# Patient Record
Sex: Male | Born: 1945 | Race: Black or African American | Hispanic: No | Marital: Married | Smoking: Former smoker
Health system: Southern US, Community
[De-identification: ages and names within clinical notes are randomized; demographics above are authoritative.]

## PROBLEM LIST (undated history)

## (undated) DIAGNOSIS — K219 Gastro-esophageal reflux disease without esophagitis: Secondary | ICD-10-CM

## (undated) DIAGNOSIS — G8929 Other chronic pain: Secondary | ICD-10-CM

## (undated) DIAGNOSIS — C189 Malignant neoplasm of colon, unspecified: Secondary | ICD-10-CM

## (undated) DIAGNOSIS — R7303 Prediabetes: Secondary | ICD-10-CM

## (undated) DIAGNOSIS — Z9989 Dependence on other enabling machines and devices: Secondary | ICD-10-CM

## (undated) DIAGNOSIS — F419 Anxiety disorder, unspecified: Secondary | ICD-10-CM

## (undated) DIAGNOSIS — M545 Low back pain, unspecified: Secondary | ICD-10-CM

## (undated) DIAGNOSIS — I219 Acute myocardial infarction, unspecified: Secondary | ICD-10-CM

## (undated) DIAGNOSIS — G4733 Obstructive sleep apnea (adult) (pediatric): Secondary | ICD-10-CM

## (undated) DIAGNOSIS — J45909 Unspecified asthma, uncomplicated: Secondary | ICD-10-CM

## (undated) DIAGNOSIS — E039 Hypothyroidism, unspecified: Secondary | ICD-10-CM

## (undated) DIAGNOSIS — I10 Essential (primary) hypertension: Secondary | ICD-10-CM

## (undated) DIAGNOSIS — Z77098 Contact with and (suspected) exposure to other hazardous, chiefly nonmedicinal, chemicals: Secondary | ICD-10-CM

## (undated) DIAGNOSIS — I429 Cardiomyopathy, unspecified: Secondary | ICD-10-CM

## (undated) DIAGNOSIS — M1990 Unspecified osteoarthritis, unspecified site: Secondary | ICD-10-CM

## (undated) DIAGNOSIS — I2510 Atherosclerotic heart disease of native coronary artery without angina pectoris: Secondary | ICD-10-CM

## (undated) HISTORY — DX: Gastro-esophageal reflux disease without esophagitis: K21.9

## (undated) HISTORY — DX: Anxiety disorder, unspecified: F41.9

## (undated) HISTORY — PX: BACK SURGERY: SHX140

## (undated) HISTORY — DX: Atherosclerotic heart disease of native coronary artery without angina pectoris: I25.10

## (undated) HISTORY — DX: Malignant neoplasm of colon, unspecified: C18.9

## (undated) HISTORY — PX: CATARACT EXTRACTION W/ INTRAOCULAR LENS  IMPLANT, BILATERAL: SHX1307

## (undated) HISTORY — DX: Hypothyroidism, unspecified: E03.9

## (undated) HISTORY — DX: Essential (primary) hypertension: I10

## (undated) HISTORY — PX: POSTERIOR LUMBAR FUSION: SHX6036

---
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
## 1990-04-28 DIAGNOSIS — I219 Acute myocardial infarction, unspecified: Secondary | ICD-10-CM

## 1990-04-28 HISTORY — DX: Acute myocardial infarction, unspecified: I21.9

## 1990-05-29 HISTORY — PX: CARDIAC CATHETERIZATION: SHX172

## 2013-06-18 ENCOUNTER — Encounter: Payer: Self-pay | Admitting: Cardiology

## 2013-08-20 ENCOUNTER — Telehealth: Payer: Self-pay | Admitting: *Deleted

## 2013-08-20 ENCOUNTER — Encounter: Payer: Self-pay | Admitting: *Deleted

## 2013-08-20 DIAGNOSIS — Z01810 Encounter for preprocedural cardiovascular examination: Secondary | ICD-10-CM

## 2013-08-20 NOTE — Telephone Encounter (Signed)
Office received a phone call from Edwin Monroe at [facility] today regarding patient Edwin Monroe. He has been diagnosed with a colonic abnormality/colitis and there is concern also for potential neoplasm based on recent colonoscopy. Edwin Monroe plans colon surgery this Friday, and the patient is in fact already undergoing a bowel prep. We were asked to schedule the patient for preoperative cardiac assessment. Limited information is available. 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 It is also reported that his ECG was nonspecific.   We have added the patient on at the earliest available slot which is tomorrow afternoon (Thursday), and I have also arranged for the patient have an echocardiogram at that time to define LVEF. Patient will be seen by Edwin Monroe tomorrow for clinical assessment. I explained to Edwin Monroe that I doubted Edwin Monroe would require other extensive cardiac testing, and can likely proceed with planned surgery as long as he was truly clinically stable, repeat ECG showed no concerning findings, and his echocardiogram was also reassuring. Otherwise he would need to be prepared for rescheduling surgery if additional cardiac testing was necessary. Edwin Monroe asked Edwin Monroe contact him tomorrow with final recommendations.  Satira Sark, M.D., F.A.C.C.

## 2013-08-21 ENCOUNTER — Encounter: Payer: Self-pay | Admitting: Cardiology

## 2013-08-21 ENCOUNTER — Other Ambulatory Visit (INDEPENDENT_AMBULATORY_CARE_PROVIDER_SITE_OTHER): Payer: Medicare Other

## 2013-08-21 ENCOUNTER — Ambulatory Visit (INDEPENDENT_AMBULATORY_CARE_PROVIDER_SITE_OTHER): Payer: Medicare Other | Admitting: Cardiology

## 2013-08-21 ENCOUNTER — Other Ambulatory Visit: Payer: Self-pay

## 2013-08-21 VITALS — BP 135/87 | HR 89 | Ht 68.0 in | Wt 196.1 lb

## 2013-08-21 DIAGNOSIS — I059 Rheumatic mitral valve disease, unspecified: Secondary | ICD-10-CM

## 2013-08-21 DIAGNOSIS — I519 Heart disease, unspecified: Secondary | ICD-10-CM

## 2013-08-21 DIAGNOSIS — I10 Essential (primary) hypertension: Secondary | ICD-10-CM

## 2013-08-21 DIAGNOSIS — Z01810 Encounter for preprocedural cardiovascular examination: Secondary | ICD-10-CM

## 2013-08-21 DIAGNOSIS — I359 Nonrheumatic aortic valve disorder, unspecified: Secondary | ICD-10-CM

## 2013-08-21 DIAGNOSIS — I5189 Other ill-defined heart diseases: Secondary | ICD-10-CM

## 2013-08-21 DIAGNOSIS — G4733 Obstructive sleep apnea (adult) (pediatric): Secondary | ICD-10-CM

## 2013-08-21 DIAGNOSIS — I2510 Atherosclerotic heart disease of native coronary artery without angina pectoris: Secondary | ICD-10-CM

## 2013-08-21 MED ORDER — CARVEDILOL 3.125 MG PO TABS: 3.1250 mg | ORAL_TABLET | Freq: Two times a day (BID) | ORAL | Status: DC

## 2013-08-21 NOTE — Patient Instructions (Signed)
Your physician recommends that you schedule a follow-up appointment in: 4 months. You will receive a reminder letter in the mail in about 1-2 months reminding you to call and schedule your appointment. If you don't receive this letter, please contact our office. Your physician has recommended you make the following change in your medication:  Start carvedilol 3.125 mg twice daily. Your new prescription has been sent to your pharmacy.  All other medications will remain the same.

## 2013-08-21 NOTE — Progress Notes (Signed)
Clinical Summary Edwin Monroe is a 68 y.o.male seen today as a new patient, he is referred for preoperative evaluation prior to abdominal surgery  1. History of chest pain - notes indicate remote history of MI in 17 in [place] at [place]. Transferred to [place], reports he was told did not have any blockages - since that time has not had any heart troubles.  - no LE edema, no orthopnea, no PND. 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  3. HTN - checks bp at home regularly, typically around 140s/80s - compliant with meds  4. OSA - compliant with CPAP   Past Medical History  Diagnosis Date  . Hypertension   . GERD (gastroesophageal reflux disease)   . Anxiety disorder   . CAD (coronary artery disease)     REMOTE H/O MI IN 1991  . Hypothyroidism      Allergies  Allergen Reactions  . Prednisone   . Toradol [Ketorolac Tromethamine]      Current Outpatient Prescriptions  Medication Sig Dispense Refill  . aspirin EC 81 MG tablet Take 81 mg by mouth daily.      Marland Kitchen buPROPion (WELLBUTRIN SR) 200 MG 12 hr tablet Take 200 mg by mouth 2 (two) times daily.      . Calcium Carbonate-Vitamin D (CALTRATE 600+D PO) Take 1 tablet by mouth 3 (three) times daily.      . cyclobenzaprine (FLEXERIL) 10 MG tablet Take 10 mg by mouth at bedtime.      . folic acid (FOLVITE) 1 MG tablet Take 1 mg by mouth daily.      . furosemide (LASIX) 20 MG tablet Take 20 mg by mouth daily.      Marland Kitchen ibuprofen (ADVIL,MOTRIN) 800 MG tablet Take 800 mg by mouth every 8 (eight) hours as needed.      Marland Kitchen lisinopril (PRINIVIL,ZESTRIL) 40 MG tablet Take 40 mg by mouth daily.      Marland Kitchen LORazepam (ATIVAN) 1 MG tablet Take 1 mg by mouth at bedtime.      . Multiple Vitamin (MULTIVITAMIN) tablet  Take 1 tablet by mouth daily.      . nisoldipine (SULAR) 17 MG 24 hr tablet Take 17 mg by mouth daily.      Marland Kitchen omeprazole (PRILOSEC) 20 MG capsule Take 20 mg by mouth daily.      Marland Kitchen oxyCODONE-acetaminophen (PERCOCET/ROXICET) 5-325 MG per tablet Take 1 tablet by mouth every 4 (four) hours as needed for severe pain.       No current facility-administered medications for this visit.     Past Surgical History  Procedure Laterality Date  . Back surgery      LOW BACK SURGERY X'S 2  . Appendectomy    . Cardiac catheterization       Allergies  Allergen Reactions  . Prednisone   . Toradol [Ketorolac Tromethamine]       Family History  Problem Relation Age of Onset  . Heart attack Father     MID 70'S  . Diabetes Mother   . Diabetes Brother     X'S 2  . Cancer Father   . Stroke Mother      Social History Edwin Monroe reports that he quit smoking about 11 years  ago. His smoking use included Cigars and Cigarettes. He smoked 1.00 pack per day. He does not have any smokeless tobacco history on file. Edwin Monroe has no alcohol history on file.   Review of Systems CONSTITUTIONAL: No weight loss, fever, chills, weakness or fatigue.  HEENT: Eyes: No visual loss, blurred vision, double vision or yellow sclerae.No hearing loss, sneezing, congestion, runny nose or sore throat.  SKIN: No rash or itching.  CARDIOVASCULAR: per HPI RESPIRATORY: No shortness of breath, cough or sputum.  GASTROINTESTINAL: No anorexia, nausea, vomiting or diarrhea. No abdominal pain or blood.  GENITOURINARY: No burning on urination, no polyuria NEUROLOGICAL: No headache, dizziness, syncope, paralysis, ataxia, numbness or tingling in the extremities. No change in bowel or bladder control.  MUSCULOSKELETAL: No muscle, back pain, joint pain or stiffness.  LYMPHATICS: No enlarged nodes. No history of splenectomy.  PSYCHIATRIC: No history of depression or anxiety.  ENDOCRINOLOGIC: No reports of sweating, cold or  heat intolerance. No polyuria or polydipsia.  Marland Kitchen   Physical Examination p 89 bp 135/87 WT 196 lbs BMI 30 Gen: resting comfortably, no acute distress HEENT: no scleral icterus, pupils equal round and reactive, no palptable cervical adenopathy,  CV: RRR, no m/r/g, no JVD, no carotid bruits Resp: Clear to auscultation bilaterally GI: abdomen is soft, non-tender, non-distended, normal bowel sounds, no hepatosplenomegaly MSK: extremities are warm, no edema.  Skin: warm, no rash Neuro:  no focal deficits Psych: appropriate affect   Diagnostic Studies [facility] EKG 05/2013 NSR, LAE, inferior Q waves  [facility] Cath 05/30/1990 DIAGNOSTIC SUMMARY Coronary Artery Disease RCA system: normal Left Main: normal No significant CAD indicated LAD system: normal Left Ventriculogram LCX system: normal Ejection Fraction: 71%  COMMENT The LAD curls the Apex. The RCA and Cirx arteries both contribute small PDA's.  07/2013 Echo LVEF 45-50%, mild LVH, grade I diastolic dysfunction, hypokinesis of the apica anterior and apical latera walls.   Assessment and Plan  1. Mild LV systolic dysfunction - echo with mildly decreased to low normal LV function, patient denies any CHF symptoms. Does have some wall motion abnormalities suggestive of possible prior event - will start low dose coreg in setting of mild LV systolic dysfunction, patient reports he is going to postpone the surgery for not within the next 2 weeks so ok to initiate beta blocker at this time  2. Preoperative evaluation  - patient is being considered for an intermediate risk procedure. He has not active acute cardiac conditions. He tolerates greater than 4 METs on a regular basis without limitation. Recommend proceeding with surgery as planned   3. HTN - continue current meds, add core as described above  4. OSA - continue CPAP    Edwin Monroe, M.D., F.A.C.C.

## 2013-08-24 DIAGNOSIS — I10 Essential (primary) hypertension: Secondary | ICD-10-CM | POA: Insufficient documentation

## 2013-08-24 DIAGNOSIS — I5189 Other ill-defined heart diseases: Secondary | ICD-10-CM | POA: Insufficient documentation

## 2013-08-24 DIAGNOSIS — G4733 Obstructive sleep apnea (adult) (pediatric): Secondary | ICD-10-CM | POA: Insufficient documentation

## 2013-08-24 DIAGNOSIS — I519 Heart disease, unspecified: Secondary | ICD-10-CM | POA: Insufficient documentation

## 2013-10-08 ENCOUNTER — Encounter: Payer: Self-pay | Admitting: Cardiology

## 2013-12-15 ENCOUNTER — Encounter: Payer: Self-pay | Admitting: Cardiology

## 2013-12-15 ENCOUNTER — Ambulatory Visit (INDEPENDENT_AMBULATORY_CARE_PROVIDER_SITE_OTHER): Payer: Medicare Other | Admitting: Cardiology

## 2013-12-15 VITALS — BP 121/67 | HR 76 | Ht 68.0 in | Wt 198.0 lb

## 2013-12-15 DIAGNOSIS — I519 Heart disease, unspecified: Secondary | ICD-10-CM

## 2013-12-15 DIAGNOSIS — I5189 Other ill-defined heart diseases: Secondary | ICD-10-CM

## 2013-12-15 DIAGNOSIS — I10 Essential (primary) hypertension: Secondary | ICD-10-CM

## 2013-12-15 MED ORDER — CARVEDILOL 12.5 MG PO TABS: 12.5000 mg | ORAL_TABLET | Freq: Two times a day (BID) | ORAL | Status: DC

## 2013-12-15 NOTE — Progress Notes (Signed)
Clinical Summary Edwin Monroe is a 68 y.o.male seen today for follow up of the following medical problems.   1. History of chest pain  - notes indicate remote history of MI in 61 in [place] at [place]. Transferred to [place], reports he was told did not have any blockages by cath at that time - since that time has not had any chest pain   2. HTN  - compliant with meds   3. OSA  - compliant with CPAP  4. Mild to low normal LV systolic function - echo 11/2618 LVEF 45-50%, some apical hypokinesis and grade I diastolic dysfunction - started on coreg last visit, tolerating well -  no orthopnea, no PND. No chest pain. Rare LE edema  5. Hyperlipidemia - followed at [facility], no recent panel in our system - compliant with statin  6. Hx of colitis - last visit he was cleared for surgery from cardiac standpoint. He report on follow up symptoms resolved, did not require surgery   Past Medical History  Diagnosis Date  . Hypertension   . GERD (gastroesophageal reflux disease)   . Anxiety disorder   . CAD (coronary artery disease)     REMOTE H/O MI IN 1991  . Hypothyroidism      Allergies  Allergen Reactions  . Prednisone Other (See Comments)    Tablet form only per patient (paranoid).  Injection form is fine.      Current Outpatient Prescriptions  Medication Sig Dispense Refill  . acetaminophen (TYLENOL) 500 MG tablet Take 1,000 mg by mouth 2 (two) times daily.      Marland Kitchen aspirin EC 81 MG tablet Take 81 mg by mouth daily.      Marland Kitchen buPROPion (WELLBUTRIN SR) 200 MG 12 hr tablet Take 200 mg by mouth daily.       . Calcium Carbonate-Vitamin D (CALTRATE 600+D PO) Take 1 tablet by mouth 3 (three) times daily.      . carvedilol (COREG) 3.125 MG tablet Take 1 tablet (3.125 mg total) by mouth 2 (two) times daily.  180 tablet  3  . cyclobenzaprine (FLEXERIL) 10 MG tablet Take 10 mg by mouth at bedtime.      . folic acid (FOLVITE) 1 MG tablet Take 1 mg by mouth daily.      . furosemide (LASIX)  20 MG tablet Take 20 mg by mouth daily.      Marland Kitchen HYDROcodone-acetaminophen (NORCO) 7.5-325 MG per tablet Take 1 tablet by mouth 2 (two) times daily as needed for moderate pain.      Marland Kitchen lisinopril (PRINIVIL,ZESTRIL) 20 MG tablet Take 20 mg by mouth daily.      Marland Kitchen LORazepam (ATIVAN) 1 MG tablet Take 1 mg by mouth at bedtime.      . Multiple Vitamin (MULTIVITAMIN) tablet Take 1 tablet by mouth daily.      . nisoldipine (SULAR) 17 MG 24 hr tablet Take 17 mg by mouth daily.      Marland Kitchen omeprazole (PRILOSEC) 20 MG capsule Take 20 mg by mouth daily.       No current facility-administered medications for this visit.     Past Surgical History  Procedure Laterality Date  . Back surgery      LOW BACK SURGERY X'S 2  . Appendectomy    . Cardiac catheterization       Allergies  Allergen Reactions  . Prednisone Other (See Comments)    Tablet form only per patient (paranoid).  Injection form is fine.  Family History  Problem Relation Age of Onset  . Heart attack Father     MID 70'S  . Diabetes Mother   . Diabetes Brother     X'S 2  . Cancer Father   . Stroke Mother      Social History Edwin Monroe reports that he quit smoking about 11 years ago. His smoking use included Cigars and Cigarettes. He smoked 1.00 pack per day. He does not have any smokeless tobacco history on file. Edwin Monroe has no alcohol history on file.   Review of Systems CONSTITUTIONAL: No weight loss, fever, chills, weakness or fatigue.  HEENT: Eyes: No visual loss, blurred vision, double vision or yellow sclerae.No hearing loss, sneezing, congestion, runny nose or sore throat.  SKIN: No rash or itching.  CARDIOVASCULAR: per HPI RESPIRATORY: No shortness of breath, cough or sputum.  GASTROINTESTINAL: No anorexia, nausea, vomiting or diarrhea. No abdominal pain or blood.  GENITOURINARY: No burning on urination, no polyuria NEUROLOGICAL: No headache, dizziness, syncope, paralysis, ataxia, numbness or tingling in  the extremities. No change in bowel or bladder control.  MUSCULOSKELETAL: No muscle, back pain, joint pain or stiffness.  LYMPHATICS: No enlarged nodes. No history of splenectomy.  PSYCHIATRIC: No history of depression or anxiety.  ENDOCRINOLOGIC: No reports of sweating, cold or heat intolerance. No polyuria or polydipsia.  Marland Kitchen   Physical Examination p 76 bp 121/67 Wt 198 lbs BMI 30 Gen: resting comfortably, no acute distress HEENT: no scleral icterus, pupils equal round and reactive, no palptable cervical adenopathy,  CV: RRR, no m/r/g, no JVD, no carotid bruits Resp: Clear to auscultation bilaterally GI: abdomen is soft, non-tender, non-distended, normal bowel sounds, no hepatosplenomegaly MSK: extremities are warm, no edema.  Skin: warm, no rash Neuro:  no focal deficits Psych: appropriate affect   Diagnostic Studies [facility] EKG 05/2013  NSR, LAE, inferior Q waves   [facility] Cath 05/30/1990  DIAGNOSTIC SUMMARY Coronary Artery Disease RCA system: normal Left Main: normal No significant CAD indicated LAD system: normal Left Ventriculogram LCX system: normal Ejection Fraction: 71%  COMMENT The LAD curls the Apex. The RCA and Cirx arteries both contribute small PDA's.  07/2013 Echo  LVEF 45-50%, mild LVH, grade I diastolic dysfunction, hypokinesis of the apica anterior and apical latera walls.      Assessment and Plan   1. Mildly decreased to low nomral LV systolic dysfunction  - echo with mildly decreased to low normal LV function, patient denies any CHF symptoms. Does have some wall motion abnormalities suggestive of possible prior event  - will increase coreg to 12.5mg  bid  2. HTN  - at goal. Will stop his nisoldopine as there is no secondary benefit, and increase his coreg to 12.5mg  bid in setting of slightly decreased LV systolic function - he is to keep bp log and bring to next pcp visit or with [place]   3. OSA  - continue CPAP  4. Hyperlipidemia - defer management to  [facility] who have been checking his labs regularly  F/u 1 year    Arnoldo Lenis, M.D., F.A.C.C.

## 2013-12-15 NOTE — Patient Instructions (Signed)
   Stop Sular (Nisoldipine)  Increase Coreg to 12.5mg  twice a day - printed script given today Continue all other medications.   Your physician has requested that you regularly monitor and record your blood pressure readings at home. Please take approximately 2 hours after your medication & bring readings to next visit with primary MD.   Your physician wants you to follow up in:  1 year.  You will receive a reminder letter in the mail one-two months in advance.  If you don't receive a letter, please call our office to schedule the follow up appointment

## 2013-12-19 ENCOUNTER — Ambulatory Visit: Payer: Medicare Other | Admitting: Cardiology

## 2014-03-05 ENCOUNTER — Other Ambulatory Visit: Payer: Self-pay | Admitting: *Deleted

## 2014-03-05 MED ORDER — CARVEDILOL 12.5 MG PO TABS: 12.5000 mg | ORAL_TABLET | Freq: Two times a day (BID) | ORAL | Status: DC

## 2014-12-15 ENCOUNTER — Encounter: Payer: Self-pay | Admitting: Cardiology

## 2014-12-15 ENCOUNTER — Encounter: Payer: Self-pay | Admitting: *Deleted

## 2014-12-15 ENCOUNTER — Ambulatory Visit (INDEPENDENT_AMBULATORY_CARE_PROVIDER_SITE_OTHER): Payer: Medicare Other | Admitting: Cardiology

## 2014-12-15 VITALS — BP 143/82 | HR 71 | Ht 68.0 in | Wt 209.1 lb

## 2014-12-15 DIAGNOSIS — I519 Heart disease, unspecified: Secondary | ICD-10-CM | POA: Diagnosis not present

## 2014-12-15 DIAGNOSIS — R0789 Other chest pain: Secondary | ICD-10-CM

## 2014-12-15 DIAGNOSIS — I10 Essential (primary) hypertension: Secondary | ICD-10-CM | POA: Diagnosis not present

## 2014-12-15 DIAGNOSIS — I5189 Other ill-defined heart diseases: Secondary | ICD-10-CM

## 2014-12-15 MED ORDER — CARVEDILOL 25 MG PO TABS: 25.0000 mg | ORAL_TABLET | Freq: Two times a day (BID) | ORAL | Status: DC

## 2014-12-15 NOTE — Progress Notes (Signed)
Patient ID: Edwin Monroe, male   DOB: 05-22-1946, 69 y.o.   MRN: [id number]     Clinical Summary Mr. Musich is a 69 y.o.male seen today for follow up of the following medical problems.   1. History of chest pain  - notes indicate remote history of MI in 71 in [place] at [place]. Transferred to [facility],cath at that time showed patent coronaries - since that time has not had any chest pain    2. HTN  - compliant with meds   3. OSA  - compliant with CPAP  4. Mild to low normal LV systolic function - echo 01/1477 LVEF 45-50%, some apical hypokinesis and grade I diastolic dysfunction - no orthopnea, no PND. No chest pain. Rare LE edema  5. Hyperlipidemia - compliant with statin    Past Medical History  Diagnosis Date  . Hypertension   . GERD (gastroesophageal reflux disease)   . Anxiety disorder   . CAD (coronary artery disease)     REMOTE H/O MI IN 1991  . Hypothyroidism      Allergies  Allergen Reactions  . Prednisone Other (See Comments)    Tablet form only per patient (paranoid).  Injection form is fine.      Current Outpatient Prescriptions  Medication Sig Dispense Refill  . aspirin EC 81 MG tablet Take 81 mg by mouth daily.    Marland Kitchen buPROPion (WELLBUTRIN SR) 200 MG 12 hr tablet Take 200 mg by mouth daily.     . Calcium Carbonate-Vitamin D (CALTRATE 600+D PO) Take 1 tablet by mouth 3 (three) times daily.    . carvedilol (COREG) 12.5 MG tablet Take 1 tablet (12.5 mg total) by mouth 2 (two) times daily. 180 tablet 3  . cyclobenzaprine (FLEXERIL) 10 MG tablet Take 10 mg by mouth at bedtime.    . folic acid (FOLVITE) 1 MG tablet Take 1 mg by mouth daily.    . furosemide (LASIX) 20 MG tablet Take 20 mg by mouth daily.    Marland Kitchen HYDROcodone-acetaminophen (NORCO) 7.5-325 MG per tablet Take 1 tablet by mouth 2 (two) times daily as needed for moderate pain.    Marland Kitchen ibuprofen (ADVIL,MOTRIN) 800 MG tablet Take 800 mg by mouth 2 (two) times daily.    Marland Kitchen lisinopril  (PRINIVIL,ZESTRIL) 40 MG tablet Take 40 mg by mouth daily. 1/2 tab daily    . LORazepam (ATIVAN) 1 MG tablet Take 1 mg by mouth at bedtime.    . Multiple Vitamin (MULTIVITAMIN) tablet Take 1 tablet by mouth daily.    Marland Kitchen omeprazole (PRILOSEC) 20 MG capsule Take 20 mg by mouth daily.    . simvastatin (ZOCOR) 20 MG tablet Take 20 mg by mouth daily. 1/2 tab daily    . terazosin (HYTRIN) 1 MG capsule Take 1 mg by mouth at bedtime.     No current facility-administered medications for this visit.     Past Surgical History  Procedure Laterality Date  . Back surgery      LOW BACK SURGERY X'S 2  . Appendectomy    . Cardiac catheterization       Allergies  Allergen Reactions  . Prednisone Other (See Comments)    Tablet form only per patient (paranoid).  Injection form is fine.       Family History  Problem Relation Age of Onset  . Heart attack Father     MID 70'S  . Diabetes Mother   . Diabetes Brother     X'S 2  . Cancer Father   .  Stroke Mother      Social History Mr. Rizzi reports that he quit smoking about 12 years ago. His smoking use included Cigars and Cigarettes. He smoked 1.00 pack per day. He does not have any smokeless tobacco history on file. Mr. Ludvigsen has no alcohol history on file.   Review of Systems CONSTITUTIONAL: No weight loss, fever, chills, weakness or fatigue.  HEENT: Eyes: No visual loss, blurred vision, double vision or yellow sclerae.No hearing loss, sneezing, congestion, runny nose or sore throat.  SKIN: No rash or itching.  CARDIOVASCULAR: per HPI RESPIRATORY: No shortness of breath, cough or sputum.  GASTROINTESTINAL: No anorexia, nausea, vomiting or diarrhea. No abdominal pain or blood.  GENITOURINARY: No burning on urination, no polyuria NEUROLOGICAL: No headache, dizziness, syncope, paralysis, ataxia, numbness or tingling in the extremities. No change in bowel or bladder control.  MUSCULOSKELETAL: No muscle, back pain, joint pain or  stiffness.  LYMPHATICS: No enlarged nodes. No history of splenectomy.  PSYCHIATRIC: No history of depression or anxiety.  ENDOCRINOLOGIC: No reports of sweating, cold or heat intolerance. No polyuria or polydipsia.  Marland Kitchen   Physical Examination Filed Vitals:   12/15/14 1256  BP: 143/82  Pulse: 71   Filed Vitals:   12/15/14 1256  Height: 5\' 8"  (1.727 m)  Weight: 209 lb 1.9 oz (94.856 kg)    Gen: resting comfortably, no acute distress HEENT: no scleral icterus, pupils equal round and reactive, no palptable cervical adenopathy,  CV: RRR, no m/r/g, no JVD Resp: Clear to auscultation bilaterally GI: abdomen is soft, non-tender, non-distended, normal bowel sounds, no hepatosplenomegaly MSK: extremities are warm, no edema.  Skin: warm, no rash Neuro:  no focal deficits Psych: appropriate affect   Diagnostic Studies [facility] EKG 05/2013  NSR, LAE, inferior Q waves   [facility] Cath 05/30/1990  DIAGNOSTIC SUMMARY Coronary Artery Disease RCA system: normal Left Main: normal No significant CAD indicated LAD system: normal Left Ventriculogram LCX system: normal Ejection Fraction: 71%  COMMENT The LAD curls the Apex. The RCA and Cirx arteries both contribute small PDA's.  07/2013 Echo  LVEF 45-50%, mild LVH, grade I diastolic dysfunction, hypokinesis of the apica anterior and apical latera walls.     Assessment and Plan  1. Mildly decreased to low nomral LV systolic dysfunction  - echo with mildly decreased to low normal LV function, patient denies any CHF symptoms. Does have some wall motion abnormalities suggestive of possible prior event  - will increase coreg to 25mg  bid  2. HTN  - above goal, will follow on higher dose of coreg.   3. OSA  - continue CPAP  4. Hyperlipidemia - defer management to [facility] who have been checking his labs regularly    F/u 6 months  Arnoldo Lenis, M.D

## 2014-12-15 NOTE — Patient Instructions (Signed)
Your physician wants you to follow-up in: [place] DR. BRANCH You will receive a reminder letter in the mail two months in advance. If you don't receive a letter, please call our office to schedule the follow-up appointment.  Your physician has recommended you make the following change in your medication:   INCREASE COREG 25 MG TWICE DAILY  WE WILL REQUEST LAB WORK FROM [facility]  Thank you for choosing [facility]!!

## 2015-06-01 ENCOUNTER — Ambulatory Visit (INDEPENDENT_AMBULATORY_CARE_PROVIDER_SITE_OTHER): Payer: Medicare Other | Admitting: Cardiology

## 2015-06-01 ENCOUNTER — Encounter: Payer: Self-pay | Admitting: *Deleted

## 2015-06-01 ENCOUNTER — Encounter: Payer: Self-pay | Admitting: Cardiology

## 2015-06-01 VITALS — BP 136/82 | HR 74 | Ht 68.0 in | Wt 216.2 lb

## 2015-06-01 DIAGNOSIS — I10 Essential (primary) hypertension: Secondary | ICD-10-CM | POA: Diagnosis not present

## 2015-06-01 DIAGNOSIS — I5189 Other ill-defined heart diseases: Secondary | ICD-10-CM

## 2015-06-01 DIAGNOSIS — I519 Heart disease, unspecified: Secondary | ICD-10-CM | POA: Diagnosis not present

## 2015-06-01 MED ORDER — CARVEDILOL 25 MG PO TABS: 25.0000 mg | ORAL_TABLET | Freq: Two times a day (BID) | ORAL | Status: AC

## 2015-06-01 NOTE — Patient Instructions (Signed)

## 2015-06-01 NOTE — Progress Notes (Signed)
Patient ID: Edwin Monroe, male   DOB: [birth year], 70 y.o.   MRN: [id number]     Clinical Summary Edwin Monroe is a 70 y.o.male seen today for follow up of the following medical problems.   1. History of chest pain  - notes indicate remote history of MI in early 24s in [place] at [place]. Transferred to [facility] at that time,cath at that time showed patent coronaries  - denies any recent chest pain. Can get some DOE with high levels of activity that is stable. Can go on treadmill for 30 minutes at intermediate rate without troubles.    2. HTN  - compliant with meds  - does not chest at home  3. OSA  - compliant with CPAP  4. Mild to low normal LV systolic function - echo [id number] LVEF 45-50%, some apical hypokinesis and grade I diastolic dysfunction  - denies any LE edema. No SOB or DOE.   5. Hyperlipidemia - compliant with statin - [facility]     Past Medical History  Diagnosis Date  . Hypertension   . GERD (gastroesophageal reflux disease)   . Anxiety disorder   . CAD (coronary artery disease)     REMOTE H/O MI IN 1991  . Hypothyroidism      Allergies  Allergen Reactions  . Prednisone Other (See Comments)    Tablet form only per patient (paranoid).  Injection form is fine.      Current Outpatient Prescriptions  Medication Sig Dispense Refill  . aspirin EC 81 MG tablet Take 81 mg by mouth daily.    Marland Kitchen buPROPion (WELLBUTRIN SR) 200 MG 12 hr tablet Take 200 mg by mouth daily.     . Calcium Carbonate-Vitamin D (CALTRATE 600+D PO) Take 1 tablet by mouth 3 (three) times daily.    . carvedilol (COREG) 25 MG tablet Take 1 tablet (25 mg total) by mouth 2 (two) times daily. 180 tablet 3  . cyclobenzaprine (FLEXERIL) 10 MG tablet Take 10 mg by mouth at bedtime.    . folic acid (FOLVITE) 1 MG tablet Take 1 mg by mouth daily.    . furosemide (LASIX) 20 MG tablet Take 20 mg by mouth daily.    Marland Kitchen HYDROcodone-acetaminophen (NORCO) 7.5-325 MG per tablet Take 1 tablet  by mouth 2 (two) times daily as needed for moderate pain.    Marland Kitchen ibuprofen (ADVIL,MOTRIN) 800 MG tablet Take 800 mg by mouth 2 (two) times daily.    Marland Kitchen lisinopril (PRINIVIL,ZESTRIL) 40 MG tablet Take 40 mg by mouth daily. 1/2 tab daily    . LORazepam (ATIVAN) 1 MG tablet Take 1 mg by mouth at bedtime.    . Multiple Vitamin (MULTIVITAMIN) tablet Take 1 tablet by mouth daily.    Marland Kitchen omeprazole (PRILOSEC) 20 MG capsule Take 20 mg by mouth daily.    . simvastatin (ZOCOR) 20 MG tablet Take 20 mg by mouth daily. 1/2 tab daily    . terazosin (HYTRIN) 1 MG capsule Take 1 mg by mouth at bedtime.     No current facility-administered medications for this visit.     Past Surgical History  Procedure Laterality Date  . Back surgery      LOW BACK SURGERY X'S 2  . Appendectomy    . Cardiac catheterization       Allergies  Allergen Reactions  . Prednisone Other (See Comments)    Tablet form only per patient (paranoid).  Injection form is fine.       Family History  Problem Relation Age of Onset  . Heart attack Father     MID 70'S  . Diabetes Mother   . Diabetes Brother     X'S 2  . Cancer Father   . Stroke Mother      Social History Edwin Monroe reports that he quit smoking about 13 years ago. His smoking use included Cigars and Cigarettes. He smoked 1.00 pack per day. He does not have any smokeless tobacco history on file. Edwin Monroe has no alcohol history on file.   Review of Systems CONSTITUTIONAL: No weight loss, fever, chills, weakness or fatigue.  HEENT: Eyes: No visual loss, blurred vision, double vision or yellow sclerae.No hearing loss, sneezing, congestion, runny nose or sore throat.  SKIN: No rash or itching.  CARDIOVASCULAR: per HPI RESPIRATORY: No shortness of breath, cough or sputum.  GASTROINTESTINAL: No anorexia, nausea, vomiting or diarrhea. No abdominal pain or blood.  GENITOURINARY: No burning on urination, no polyuria NEUROLOGICAL: No headache, dizziness,  syncope, paralysis, ataxia, numbness or tingling in the extremities. No change in bowel or bladder control.  MUSCULOSKELETAL: No muscle, back pain, joint pain or stiffness.  LYMPHATICS: No enlarged nodes. No history of splenectomy.  PSYCHIATRIC: No history of depression or anxiety.  ENDOCRINOLOGIC: No reports of sweating, cold or heat intolerance. No polyuria or polydipsia.  Marland Kitchen   Physical Examination There were no vitals filed for this visit. There were no vitals filed for this visit.  Gen: resting comfortably, no acute distress HEENT: no scleral icterus, pupils equal round and reactive, no palptable cervical adenopathy,  CV Resp: Clear to auscultation bilaterally GI: abdomen is soft, non-tender, non-distended, normal bowel sounds, no hepatosplenomegaly MSK: extremities are warm, no edema.  Skin: warm, no rash Neuro:  no focal deficits Psych: appropriate affect   Diagnostic Studies [facility] EKG 05/2013  NSR, LAE, inferior Q waves   [facility] Cath 05/30/1990  DIAGNOSTIC SUMMARY Coronary Artery Disease RCA system: normal Left Main: normal No significant CAD indicated LAD system: normal Left Ventriculogram LCX system: normal Ejection Fraction: 71%  COMMENT The LAD curls the Apex. The RCA and Cirx arteries both contribute small PDA's.  07/2013 Echo  LVEF 45-50%, mild LVH, grade I diastolic dysfunction, hypokinesis of the apica anterior and apical latera walls.     Assessment and Plan   1. Mildly decreased to low nomral LV systolic dysfunction  - echo with mildly decreased to low normal LV function, patient denies any CHF symptoms. Does have some wall motion abnormalities suggestive of possible prior event  - continue current meds  2. HTN  - at goal, continue current meds  3. OSA  - continue CPAP  4. Hyperlipidemia - defer management to [place] who have been checking his labs regularly  F/u 1 year. Request labs from pcp    Arnoldo Lenis, M.D.

## 2015-12-22 ENCOUNTER — Telehealth: Payer: Self-pay | Admitting: *Deleted

## 2015-12-22 NOTE — Telephone Encounter (Signed)
I agree if he is having significant symptoms best and safest option is to be evaluated in the ER, and recommend he do so. We can add him on either Wed or Thurs of this upcoming week in the 340pm slot.   Zandra Abts MD

## 2015-12-22 NOTE — Telephone Encounter (Signed)
Patient calling with c/o chest pain, SOB.  SOB x last month.  Chest pain off / on.  No active pain currently.  Also, c/o left shoulder / arm pain last week.  Offered OV in [place] with PA or NP, but he declined and stated he would wait to see Dr. Harl Bowie.  OV scheduled for [place] for 01/20/2016 with Dr. Harl Bowie.  In the meantime, instructed him to go to ED for evaluation if symptoms worsen.  Stated that he did not want to go to hospital in [place].  Suggested that he could go to [facility] in [place] or [facility] in [place].  Patient informed that message will be sent to provider for any further advice.  He verbalized understanding.

## 2015-12-22 NOTE — Telephone Encounter (Signed)
Patient notified.  OV rescheduled for Thursday, 12/30/2015 at 3:40 with Dr. Harl Bowie.

## 2015-12-30 ENCOUNTER — Encounter: Payer: Self-pay | Admitting: *Deleted

## 2015-12-30 ENCOUNTER — Encounter: Payer: Self-pay | Admitting: Cardiology

## 2015-12-30 ENCOUNTER — Ambulatory Visit (INDEPENDENT_AMBULATORY_CARE_PROVIDER_SITE_OTHER): Payer: Medicare Other | Admitting: Cardiology

## 2015-12-30 ENCOUNTER — Telehealth: Payer: Self-pay | Admitting: Cardiology

## 2015-12-30 VITALS — BP 149/82 | HR 77 | Ht 68.0 in | Wt 216.8 lb

## 2015-12-30 DIAGNOSIS — R079 Chest pain, unspecified: Secondary | ICD-10-CM | POA: Diagnosis not present

## 2015-12-30 MED ORDER — NITROGLYCERIN 0.4 MG SL SUBL: 0.4000 mg | SUBLINGUAL_TABLET | SUBLINGUAL | 3 refills | Status: DC | PRN

## 2015-12-30 NOTE — Progress Notes (Signed)
Clinical Summary Mr. Woitas is a 70 y.o.male seen today for follow up of the following medical problems. This is a focused visit on recent chest pain.   1. History of chest pain  - notes indicate remote history of MI in early 49s in [place] at [place]. Transferred to [facility] at that time,cath at that time showed patent coronaries   - recent chest pain. Started a few weeks ago. Midchest sharp pain, 8-10/10. Can occur at rest or with activity. Can radiate to left arm and left jaw. Not positional. No relation to food. +SOB. - DOE walking upstairs,having to stop due to DOE. This is new for him - Pain increasing in frequency since onset - similar to pain in 1991 when he had previous MI    Past Medical History:  Diagnosis Date  . Anxiety disorder   . CAD (coronary artery disease)    REMOTE H/O MI IN 1991  . GERD (gastroesophageal reflux disease)   . Hypertension   . Hypothyroidism      Allergies  Allergen Reactions  . Prednisone Other (See Comments)    Tablet form only per patient (paranoid).  Injection form is fine.      Current Outpatient Prescriptions  Medication Sig Dispense Refill  . aspirin EC 81 MG tablet Take 81 mg by mouth daily.    Marland Kitchen buPROPion (WELLBUTRIN SR) 200 MG 12 hr tablet Take 200 mg by mouth daily.     . Calcium Carbonate-Vitamin D (CALTRATE 600+D PO) Take 1 tablet by mouth 3 (three) times daily.    . carvedilol (COREG) 25 MG tablet Take 1 tablet (25 mg total) by mouth 2 (two) times daily. 180 tablet 3  . cyclobenzaprine (FLEXERIL) 10 MG tablet Take 10 mg by mouth at bedtime.    . folic acid (FOLVITE) 1 MG tablet Take 1 mg by mouth daily.    . furosemide (LASIX) 20 MG tablet Take 20 mg by mouth daily.    Marland Kitchen gabapentin (NEURONTIN) 300 MG capsule Take 1 capsule by mouth at bedtime.  2  . HYDROcodone-acetaminophen (NORCO) 7.5-325 MG per tablet Take 1 tablet by mouth 2 (two) times daily as needed for moderate pain.    Marland Kitchen ibuprofen (ADVIL,MOTRIN) 800 MG  tablet Take 800 mg by mouth 2 (two) times daily.    Marland Kitchen lisinopril (PRINIVIL,ZESTRIL) 40 MG tablet Take 40 mg by mouth daily. 1/2 tab daily    . LORazepam (ATIVAN) 1 MG tablet Take 1 mg by mouth at bedtime.    . Multiple Vitamin (MULTIVITAMIN) tablet Take 1 tablet by mouth daily.    Marland Kitchen omeprazole (PRILOSEC) 20 MG capsule Take 20 mg by mouth daily.    . simvastatin (ZOCOR) 20 MG tablet Take 20 mg by mouth daily. 1/2 tab daily    . terazosin (HYTRIN) 1 MG capsule Take 1 mg by mouth at bedtime.     No current facility-administered medications for this visit.      Past Surgical History:  Procedure Laterality Date  . APPENDECTOMY    . BACK SURGERY     LOW BACK SURGERY X'S 2  . CARDIAC CATHETERIZATION       Allergies  Allergen Reactions  . Prednisone Other (See Comments)    Tablet form only per patient (paranoid).  Injection form is fine.       Family History  Problem Relation Age of Onset  . Heart attack Father     MID 70'S  . Diabetes Mother   .  Diabetes Brother     X'S 2  . Cancer Father   . Stroke Mother      Social History Mr. Mossey reports that he quit smoking about 13 years ago. His smoking use included Cigars and Cigarettes. He smoked 1.00 pack per day. He does not have any smokeless tobacco history on file. Mr. Miera has no alcohol history on file.   Review of Systems CONSTITUTIONAL: No weight loss, fever, chills, weakness or fatigue.  HEENT: Eyes: No visual loss, blurred vision, double vision or yellow sclerae.No hearing loss, sneezing, congestion, runny nose or sore throat.  SKIN: No rash or itching.  CARDIOVASCULAR: per HPI RESPIRATORY: No shortness of breath, cough or sputum.  GASTROINTESTINAL: No anorexia, nausea, vomiting or diarrhea. No abdominal pain or blood.  GENITOURINARY: No burning on urination, no polyuria NEUROLOGICAL: No headache, dizziness, syncope, paralysis, ataxia, numbness or tingling in the extremities. No change in bowel or bladder  control.  MUSCULOSKELETAL: No muscle, back pain, joint pain or stiffness.  LYMPHATICS: No enlarged nodes. No history of splenectomy.  PSYCHIATRIC: No history of depression or anxiety.  ENDOCRINOLOGIC: No reports of sweating, cold or heat intolerance. No polyuria or polydipsia.  Marland Kitchen   Physical Examination Vitals:   12/30/15 1528  BP: (!) 149/82  Pulse: 77   Vitals:   12/30/15 1528  Weight: 216 lb 12.8 oz (98.3 kg)  Height: 5\' 8"  (1.727 m)    Gen: resting comfortably, no acute distress HEENT: no scleral icterus, pupils equal round and reactive, no palptable cervical adenopathy,  CV Resp: Clear to auscultation bilaterally GI: abdomen is soft, non-tender, non-distended, normal bowel sounds, no hepatosplenomegaly MSK: extremities are warm, no edema.  Skin: warm, no rash Neuro:  no focal deficits Psych: appropriate affect   Diagnostic Studies [facility] EKG 05/2013  NSR, LAE, inferior Q waves   [facility] Cath 05/30/1990  DIAGNOSTIC SUMMARY Coronary Artery Disease RCA system: normal Left Main: normal No significant CAD indicated LAD system: normal Left Ventriculogram LCX system: normal Ejection Fraction: 71%  COMMENT The LAD curls the Apex. The RCA and Cirx arteries both contribute small PDA's.    07/2013 Echo  LVEF 45-50%, mild LVH, grade I diastolic dysfunction, hypokinesis of the apical anterior and apical latera walls.     Assessment and Plan  1. Chest pain - recent symptoms concerning for angina. EKG in clinic shows SR without acute ischemic changes.  - echo 2015 with low normal to mildly decreased LVEF, some evidence of apical hypokinesis. In the absence of symptoms at that time he was medically managed - based on high suspicion of CAD based on symptoms and prior echo findings, we will plan for LHC to further evaluate - start SL NG.    F/u 1 month  I have reviewed the risks, indications, and alternatives to cardiac catheterization, possible angioplasty, and  stenting with the patient. Risks include but are not limited to bleeding, infection, vascular injury, stroke, myocardial infection, arrhythmia, kidney injury, radiation-related injury in the case of prolonged fluoroscopy use, emergency cardiac surgery, and death. The patient understands the risks of serious complication is 1-2 in 123XX123 with diagnostic cardiac cath and 1-2% or less with angioplasty/stenting.     Arnoldo Lenis, M.D.

## 2015-12-30 NOTE — Patient Instructions (Signed)
Your physician recommends that you schedule a follow-up appointment in: [place] DR. BRANCH  Your physician has recommended you make the following change in your medication:   TAKE NITROGLYCERIN 1 TAB UNDER THE TONGUE AS NEEDED FOR CHEST PAIN  Your physician has requested that you have a cardiac catheterization. Cardiac catheterization is used to diagnose and/or treat various heart conditions. Doctors may recommend this procedure for a number of different reasons. The most common reason is to evaluate chest pain. Chest pain can be a symptom of coronary artery disease (CAD), and cardiac catheterization can show whether plaque is narrowing or blocking your heart's arteries. This procedure is also used to evaluate the valves, as well as measure the blood flow and oxygen levels in different parts of your heart. For further information please visit [email or web address]. Please follow instruction sheet, as given.  Nitroglycerin sublingual tablets What is this medicine? NITROGLYCERIN (nye troe GLI ser in) is a type of vasodilator. It relaxes blood vessels, increasing the blood and oxygen supply to your heart. This medicine is used to relieve chest pain caused by angina. It is also used to prevent chest pain before activities like climbing stairs, going outdoors in cold weather, or sexual activity. This medicine may be used for other purposes; ask your health care provider or pharmacist if you have questions. What should I tell my health care provider before I take this medicine? They need to know if you have any of these conditions: -anemia -head injury, recent stroke, or bleeding in the brain -liver disease -previous heart attack -an unusual or allergic reaction to nitroglycerin, other medicines, foods, dyes, or preservatives -pregnant or trying to get pregnant -breast-feeding How should I use this medicine? Take this medicine by mouth as needed. At the first sign of an angina attack (chest pain  or tightness) place one tablet under your tongue. You can also take this medicine 5 to 10 minutes before an event likely to produce chest pain. Follow the directions on the prescription label. Let the tablet dissolve under the tongue. Do not swallow whole. Replace the dose if you accidentally swallow it. It will help if your mouth is not dry. Saliva around the tablet will help it to dissolve more quickly. Do not eat or drink, smoke or chew tobacco while a tablet is dissolving. If you are not better within 5 minutes after taking ONE dose of nitroglycerin, call 9-1-1 immediately to seek emergency medical care. Do not take more than 3 nitroglycerin tablets over 15 minutes. If you take this medicine often to relieve symptoms of angina, your doctor or health care professional may provide you with different instructions to manage your symptoms. If symptoms do not go away after following these instructions, it is important to call 9-1-1 immediately. Do not take more than 3 nitroglycerin tablets over 15 minutes. Talk to your pediatrician regarding the use of this medicine in children. Special care may be needed. Overdosage: If you think you have taken too much of this medicine contact a poison control center or emergency room at once. NOTE: This medicine is only for you. Do not share this medicine with others. What if I miss a dose? This does not apply. This medicine is only used as needed. What may interact with this medicine? Do not take this medicine with any of the following medications: -certain migraine medicines like ergotamine and dihydroergotamine (DHE) -medicines used to treat erectile dysfunction like sildenafil, tadalafil, and vardenafil -riociguat This medicine may also interact with the  following medications: -alteplase -aspirin -heparin -medicines for high blood pressure -medicines for mental depression -other medicines used to treat angina -phenothiazines like chlorpromazine, mesoridazine,  prochlorperazine, thioridazine This list may not describe all possible interactions. Give your health care provider a list of all the medicines, herbs, non-prescription drugs, or dietary supplements you use. Also tell them if you smoke, drink alcohol, or use illegal drugs. Some items may interact with your medicine. What should I watch for while using this medicine? Tell your doctor or health care professional if you feel your medicine is no longer working. Keep this medicine with you at all times. Sit or lie down when you take your medicine to prevent falling if you feel dizzy or faint after using it. Try to remain calm. This will help you to feel better faster. If you feel dizzy, take several deep breaths and lie down with your feet propped up, or bend forward with your head resting between your knees. You may get drowsy or dizzy. Do not drive, use machinery, or do anything that needs mental alertness until you know how this drug affects you. Do not stand or sit up quickly, especially if you are an older patient. This reduces the risk of dizzy or fainting spells. Alcohol can make you more drowsy and dizzy. Avoid alcoholic drinks. Do not treat yourself for coughs, colds, or pain while you are taking this medicine without asking your doctor or health care professional for advice. Some ingredients may increase your blood pressure. What side effects may I notice from receiving this medicine? Side effects that you should report to your doctor or health care professional as soon as possible: -blurred vision -dry mouth -skin rash -sweating -the feeling of extreme pressure in the head -unusually weak or tired Side effects that usually do not require medical attention (report to your doctor or health care professional if they continue or are bothersome): -flushing of the face or neck -headache -irregular heartbeat, palpitations -nausea, vomiting This list may not describe all possible side effects. Call  your doctor for medical advice about side effects. You may report side effects to FDA at 1-800-FDA-1088. Where should I keep my medicine? Keep out of the reach of children. Store at room temperature between 20 and 25 degrees C (68 and 77 degrees F). Store in [employer]. Protect from light and moisture. Keep tightly closed. Throw away any unused medicine after the expiration date. NOTE: This sheet is a summary. It may not cover all possible information. If you have questions about this medicine, talk to your doctor, pharmacist, or health care provider.    2016, [employer]. (2013-03-13 17:57:36)

## 2015-12-30 NOTE — Telephone Encounter (Signed)
[facility] 01/06/16 @7AM 

## 2016-01-06 ENCOUNTER — Ambulatory Visit (HOSPITAL_COMMUNITY)
Admission: RE | Admit: 2016-01-06 | Discharge: 2016-01-07 | Disposition: A | Discharge status: Home / Self Care | Payer: Medicare Other | Source: Ambulatory Visit | Attending: Cardiovascular Disease | Admitting: Cardiovascular Disease

## 2016-01-06 ENCOUNTER — Encounter (HOSPITAL_COMMUNITY): Payer: Self-pay | Admitting: Cardiovascular Disease

## 2016-01-06 ENCOUNTER — Encounter (HOSPITAL_COMMUNITY)
Admission: RE | Disposition: A | Discharge status: Home / Self Care | Payer: Self-pay | Source: Ambulatory Visit | Attending: Cardiovascular Disease

## 2016-01-06 DIAGNOSIS — G4733 Obstructive sleep apnea (adult) (pediatric): Secondary | ICD-10-CM | POA: Diagnosis present

## 2016-01-06 DIAGNOSIS — Z87891 Personal history of nicotine dependence: Secondary | ICD-10-CM | POA: Insufficient documentation

## 2016-01-06 DIAGNOSIS — I429 Cardiomyopathy, unspecified: Secondary | ICD-10-CM | POA: Insufficient documentation

## 2016-01-06 DIAGNOSIS — I252 Old myocardial infarction: Secondary | ICD-10-CM | POA: Insufficient documentation

## 2016-01-06 DIAGNOSIS — Z79899 Other long term (current) drug therapy: Secondary | ICD-10-CM | POA: Diagnosis not present

## 2016-01-06 DIAGNOSIS — F419 Anxiety disorder, unspecified: Secondary | ICD-10-CM | POA: Diagnosis not present

## 2016-01-06 DIAGNOSIS — I2510 Atherosclerotic heart disease of native coronary artery without angina pectoris: Secondary | ICD-10-CM | POA: Diagnosis present

## 2016-01-06 DIAGNOSIS — I10 Essential (primary) hypertension: Secondary | ICD-10-CM | POA: Diagnosis present

## 2016-01-06 DIAGNOSIS — I25110 Atherosclerotic heart disease of native coronary artery with unstable angina pectoris: Secondary | ICD-10-CM | POA: Diagnosis not present

## 2016-01-06 DIAGNOSIS — K219 Gastro-esophageal reflux disease without esophagitis: Secondary | ICD-10-CM | POA: Insufficient documentation

## 2016-01-06 DIAGNOSIS — I200 Unstable angina: Secondary | ICD-10-CM | POA: Diagnosis present

## 2016-01-06 DIAGNOSIS — Z791 Long term (current) use of non-steroidal anti-inflammatories (NSAID): Secondary | ICD-10-CM | POA: Insufficient documentation

## 2016-01-06 DIAGNOSIS — R079 Chest pain, unspecified: Secondary | ICD-10-CM

## 2016-01-06 DIAGNOSIS — Z7982 Long term (current) use of aspirin: Secondary | ICD-10-CM | POA: Diagnosis not present

## 2016-01-06 DIAGNOSIS — R7303 Prediabetes: Secondary | ICD-10-CM | POA: Diagnosis present

## 2016-01-06 HISTORY — DX: Contact with and (suspected) exposure to other hazardous, chiefly nonmedicinal, chemicals: Z77.098

## 2016-01-06 HISTORY — DX: Other chronic pain: G89.29

## 2016-01-06 HISTORY — DX: Cardiomyopathy, unspecified: I42.9

## 2016-01-06 HISTORY — DX: Unspecified asthma, uncomplicated: J45.909

## 2016-01-06 HISTORY — DX: Low back pain, unspecified: M54.50

## 2016-01-06 HISTORY — DX: Unspecified osteoarthritis, unspecified site: M19.90

## 2016-01-06 HISTORY — DX: Obstructive sleep apnea (adult) (pediatric): Z99.89

## 2016-01-06 HISTORY — DX: Acute myocardial infarction, unspecified: I21.9

## 2016-01-06 HISTORY — DX: Prediabetes: R73.03

## 2016-01-06 HISTORY — DX: Obstructive sleep apnea (adult) (pediatric): G47.33

## 2016-01-06 HISTORY — DX: Low back pain: M54.5

## 2016-01-06 HISTORY — PX: CARDIAC CATHETERIZATION: SHX172

## 2016-01-06 LAB — CBC
HEMATOCRIT: 42.6 % (ref 39.0–52.0)
Hemoglobin: 14 g/dL (ref 13.0–17.0)
MCH: 29.7 pg (ref 26.0–34.0)
MCHC: 32.9 g/dL (ref 30.0–36.0)
MCV: 90.4 fL (ref 78.0–100.0)
Platelets: 229 10*3/uL (ref 150–400)
RBC: 4.71 MIL/uL (ref 4.22–5.81)
RDW: 12.5 % (ref 11.5–15.5)
WBC: 7.6 10*3/uL (ref 4.0–10.5)

## 2016-01-06 LAB — BASIC METABOLIC PANEL
Anion gap: 11 (ref 5–15)
BUN: 16 mg/dL (ref 6–20)
CHLORIDE: 102 mmol/L (ref 101–111)
CO2: 26 mmol/L (ref 22–32)
Calcium: 9.2 mg/dL (ref 8.9–10.3)
Creatinine, Ser: 0.81 mg/dL (ref 0.61–1.24)
GFR calc Af Amer: >60 mL/min (ref >60)
GFR calc non Af Amer: >60 mL/min (ref >60)
Glucose, Bld: 137 mg/dL — ABNORMAL HIGH (ref 65–99)
POTASSIUM: 3.8 mmol/L (ref 3.5–5.1)
SODIUM: 139 mmol/L (ref 135–145)

## 2016-01-06 LAB — POCT ACTIVATED CLOTTING TIME: Activated Clotting Time: 637 seconds

## 2016-01-06 LAB — PROTIME-INR
INR: 0.99
Prothrombin Time: 13.1 seconds (ref 11.4–15.2)

## 2016-01-06 SURGERY — LEFT HEART CATH AND CORONARY ANGIOGRAPHY
Anesthesia: LOCAL

## 2016-01-06 MED ORDER — CYCLOBENZAPRINE HCL 10 MG PO TABS
10.0000 mg | ORAL_TABLET | Freq: Every day | ORAL | Status: DC
  Administered 2016-01-06: 20:00:00 10 mg via ORAL
  Filled 2016-01-06: qty 1

## 2016-01-06 MED ORDER — HYDRALAZINE HCL 20 MG/ML IJ SOLN
10.0000 mg | INTRAMUSCULAR | Status: DC | PRN
  Administered 2016-01-06 (×2): 10 mg via INTRAVENOUS
  Filled 2016-01-06 (×2): qty 1

## 2016-01-06 MED ORDER — TICAGRELOR 90 MG PO TABS
ORAL_TABLET | ORAL | Status: AC
  Filled 2016-01-06: qty 1

## 2016-01-06 MED ORDER — PROMETHAZINE HCL 25 MG/ML IJ SOLN
12.5000 mg | Freq: Four times a day (QID) | INTRAMUSCULAR | Status: DC | PRN
  Administered 2016-01-06: 12.5 mg via INTRAVENOUS
  Filled 2016-01-06: qty 1

## 2016-01-06 MED ORDER — ASPIRIN 81 MG PO CHEW: 81.0000 mg | CHEWABLE_TABLET | Freq: Every day | ORAL | Status: DC

## 2016-01-06 MED ORDER — IOPAMIDOL (ISOVUE-370) INJECTION 76%
INTRAVENOUS | Status: DC | PRN
  Administered 2016-01-06: 175 mL

## 2016-01-06 MED ORDER — SODIUM CHLORIDE 0.9 % WEIGHT BASED INFUSION
3.0000 mL/kg/h | INTRAVENOUS | Status: DC
  Administered 2016-01-06: 3 mL/kg/h via INTRAVENOUS

## 2016-01-06 MED ORDER — TICAGRELOR 90 MG PO TABS
90.0000 mg | ORAL_TABLET | Freq: Two times a day (BID) | ORAL | Status: DC
  Administered 2016-01-06 – 2016-01-07 (×2): 90 mg via ORAL
  Filled 2016-01-06 (×2): qty 1

## 2016-01-06 MED ORDER — IOPAMIDOL (ISOVUE-370) INJECTION 76%
INTRAVENOUS | Status: AC
  Filled 2016-01-06: qty 100

## 2016-01-06 MED ORDER — SODIUM CHLORIDE 0.9 % IV SOLN
INTRAVENOUS | Status: DC | PRN
  Administered 2016-01-06: 1.75 mg/kg/h via INTRAVENOUS

## 2016-01-06 MED ORDER — ZOLPIDEM TARTRATE 5 MG PO TABS: 5.0000 mg | ORAL_TABLET | Freq: Every evening | ORAL | Status: DC | PRN

## 2016-01-06 MED ORDER — SODIUM CHLORIDE 0.9 % IV SOLN: 250.0000 mL | INTRAVENOUS | Status: DC | PRN

## 2016-01-06 MED ORDER — VERAPAMIL HCL 2.5 MG/ML IV SOLN
INTRAVENOUS | Status: AC
  Filled 2016-01-06: qty 2

## 2016-01-06 MED ORDER — VERAPAMIL HCL 2.5 MG/ML IV SOLN
INTRA_ARTERIAL | Status: DC | PRN
  Administered 2016-01-06: 08:00:00 via INTRA_ARTERIAL

## 2016-01-06 MED ORDER — HYDRALAZINE HCL 20 MG/ML IJ SOLN
10.0000 mg | Freq: Once | INTRAMUSCULAR | Status: AC
Start: 2016-01-06 — End: 2016-01-06
  Administered 2016-01-06: 10 mg via INTRAVENOUS
  Filled 2016-01-06: qty 1

## 2016-01-06 MED ORDER — SODIUM CHLORIDE 0.9% FLUSH: 3.0000 mL | Freq: Two times a day (BID) | INTRAVENOUS | Status: DC

## 2016-01-06 MED ORDER — ACETAMINOPHEN 325 MG PO TABS
650.0000 mg | ORAL_TABLET | ORAL | Status: DC | PRN
  Administered 2016-01-07: 05:00:00 650 mg via ORAL
  Filled 2016-01-06: qty 2

## 2016-01-06 MED ORDER — SODIUM CHLORIDE 0.9 % WEIGHT BASED INFUSION: 1.0000 mL/kg/h | INTRAVENOUS | Status: DC

## 2016-01-06 MED ORDER — FENTANYL CITRATE (PF) 100 MCG/2ML IJ SOLN
INTRAMUSCULAR | Status: AC
  Filled 2016-01-06: qty 2

## 2016-01-06 MED ORDER — MIDAZOLAM HCL 2 MG/2ML IJ SOLN
INTRAMUSCULAR | Status: DC | PRN
  Administered 2016-01-06: 2 mg via INTRAVENOUS

## 2016-01-06 MED ORDER — ASPIRIN 81 MG PO CHEW: 81.0000 mg | CHEWABLE_TABLET | ORAL | Status: DC

## 2016-01-06 MED ORDER — SODIUM CHLORIDE 0.9 % IV SOLN
INTRAVENOUS | Status: DC
  Administered 2016-01-06: 10:00:00 via INTRAVENOUS

## 2016-01-06 MED ORDER — BIVALIRUDIN BOLUS VIA INFUSION - CUPID
INTRAVENOUS | Status: DC | PRN
  Administered 2016-01-06: 73.5 mg via INTRAVENOUS

## 2016-01-06 MED ORDER — DOCUSATE SODIUM 100 MG PO CAPS: 100.0000 mg | ORAL_CAPSULE | Freq: Every day | ORAL | Status: DC | PRN

## 2016-01-06 MED ORDER — DIAZEPAM 5 MG PO TABS: 5.0000 mg | ORAL_TABLET | Freq: Four times a day (QID) | ORAL | Status: DC | PRN

## 2016-01-06 MED ORDER — FENTANYL CITRATE (PF) 100 MCG/2ML IJ SOLN
INTRAMUSCULAR | Status: DC | PRN
  Administered 2016-01-06: 25 ug via INTRAVENOUS

## 2016-01-06 MED ORDER — HEPARIN SODIUM (PORCINE) 1000 UNIT/ML IJ SOLN
INTRAMUSCULAR | Status: DC | PRN
Start: 2016-01-06 — End: 2016-01-06
  Administered 2016-01-06: 5000 [IU] via INTRAVENOUS

## 2016-01-06 MED ORDER — NITROGLYCERIN 1 MG/10 ML FOR IR/CATH LAB
INTRA_ARTERIAL | Status: DC | PRN
  Administered 2016-01-06: 200 ug via INTRACORONARY
  Administered 2016-01-06: 100 ug via INTRACORONARY
  Administered 2016-01-06: 200 ug via INTRACORONARY

## 2016-01-06 MED ORDER — LORAZEPAM 0.5 MG PO TABS
1.0000 mg | ORAL_TABLET | Freq: Four times a day (QID) | ORAL | Status: DC | PRN
Start: 2016-01-06 — End: 2016-01-07
  Administered 2016-01-06: 15:00:00 1 mg via ORAL
  Filled 2016-01-06: qty 2

## 2016-01-06 MED ORDER — HEPARIN SODIUM (PORCINE) 1000 UNIT/ML IJ SOLN
INTRAMUSCULAR | Status: AC
  Filled 2016-01-06: qty 1

## 2016-01-06 MED ORDER — BIVALIRUDIN 250 MG IV SOLR
INTRAVENOUS | Status: AC
  Filled 2016-01-06: qty 250

## 2016-01-06 MED ORDER — NITROGLYCERIN 1 MG/10 ML FOR IR/CATH LAB
INTRA_ARTERIAL | Status: AC
  Filled 2016-01-06: qty 10

## 2016-01-06 MED ORDER — ANGIOPLASTY BOOK
Freq: Once | Status: AC
  Administered 2016-01-06: 21:00:00
  Filled 2016-01-06: qty 1

## 2016-01-06 MED ORDER — HYDRALAZINE HCL 20 MG/ML IJ SOLN
20.0000 mg | Freq: Once | INTRAMUSCULAR | Status: AC
  Administered 2016-01-06: 20 mg via INTRAVENOUS
  Filled 2016-01-06: qty 1

## 2016-01-06 MED ORDER — ONDANSETRON HCL 4 MG/2ML IJ SOLN
4.0000 mg | Freq: Four times a day (QID) | INTRAMUSCULAR | Status: DC | PRN
  Administered 2016-01-06: 4 mg via INTRAVENOUS
  Filled 2016-01-06: qty 2

## 2016-01-06 MED ORDER — GABAPENTIN 300 MG PO CAPS
300.0000 mg | ORAL_CAPSULE | Freq: Every day | ORAL | Status: DC
  Administered 2016-01-06: 300 mg via ORAL
  Filled 2016-01-06: qty 1

## 2016-01-06 MED ORDER — SODIUM CHLORIDE 0.9% FLUSH: 3.0000 mL | INTRAVENOUS | Status: DC | PRN

## 2016-01-06 MED ORDER — SODIUM CHLORIDE 0.9 % IV SOLN
INTRAVENOUS | Status: DC | PRN
  Administered 2016-01-06: 150 mL/h via INTRAVENOUS

## 2016-01-06 MED ORDER — CARVEDILOL 12.5 MG PO TABS
25.0000 mg | ORAL_TABLET | Freq: Two times a day (BID) | ORAL | Status: DC
  Administered 2016-01-06 – 2016-01-07 (×2): 25 mg via ORAL
  Filled 2016-01-06 (×2): qty 2

## 2016-01-06 MED ORDER — NISOLDIPINE ER 17 MG PO TB24
17.0000 mg | ORAL_TABLET | Freq: Every day | ORAL | Status: DC
  Administered 2016-01-07: 09:00:00 17 mg via ORAL
  Filled 2016-01-06: qty 1

## 2016-01-06 MED ORDER — MIDAZOLAM HCL 2 MG/2ML IJ SOLN
INTRAMUSCULAR | Status: AC
  Filled 2016-01-06: qty 2

## 2016-01-06 MED ORDER — LORAZEPAM 0.5 MG PO TABS: 1.0000 mg | ORAL_TABLET | Freq: Every day | ORAL | Status: DC

## 2016-01-06 MED ORDER — NITROGLYCERIN 0.4 MG SL SUBL: 0.4000 mg | SUBLINGUAL_TABLET | SUBLINGUAL | Status: DC | PRN

## 2016-01-06 MED ORDER — TICAGRELOR 90 MG PO TABS
ORAL_TABLET | ORAL | Status: DC | PRN
  Administered 2016-01-06: 180 mg via ORAL

## 2016-01-06 MED ORDER — HEPARIN (PORCINE) IN NACL 2-0.9 UNIT/ML-% IJ SOLN
INTRAMUSCULAR | Status: AC
  Filled 2016-01-06: qty 1500

## 2016-01-06 MED ORDER — LIDOCAINE HCL (PF) 1 % IJ SOLN
INTRAMUSCULAR | Status: AC
  Filled 2016-01-06: qty 30

## 2016-01-06 MED ORDER — PANTOPRAZOLE SODIUM 40 MG PO TBEC
40.0000 mg | DELAYED_RELEASE_TABLET | Freq: Every day | ORAL | Status: DC
Start: 2016-01-07 — End: 2016-01-07
  Administered 2016-01-07: 09:00:00 40 mg via ORAL
  Filled 2016-01-06: qty 1

## 2016-01-06 MED ORDER — HEPARIN (PORCINE) IN NACL 2-0.9 UNIT/ML-% IJ SOLN
INTRAMUSCULAR | Status: DC | PRN
  Administered 2016-01-06: 1500 mL

## 2016-01-06 MED ORDER — LIDOCAINE HCL (PF) 1 % IJ SOLN
INTRAMUSCULAR | Status: DC | PRN
  Administered 2016-01-06: 2 mL via SUBCUTANEOUS

## 2016-01-06 MED ORDER — TERAZOSIN HCL 1 MG PO CAPS
1.0000 mg | ORAL_CAPSULE | Freq: Every day | ORAL | Status: DC
  Administered 2016-01-06: 20:00:00 1 mg via ORAL
  Filled 2016-01-06: qty 1

## 2016-01-06 SURGICAL SUPPLY — 18 items
BALLN EMERGE MR 2.5X15 (BALLOONS) ×2
BALLN ~~LOC~~ TREK RX 3.0X15 (BALLOONS) ×2
BALLOON EMERGE MR 2.5X15 (BALLOONS) ×1 IMPLANT
BALLOON ~~LOC~~ TREK RX 3.0X15 (BALLOONS) ×1 IMPLANT
CATH INFINITI 5FR ANG PIGTAIL (CATHETERS) ×2 IMPLANT
CATH OPTITORQUE TIG 4.0 5F (CATHETERS) ×2 IMPLANT
CATH VISTA GUIDE 6FR JR4 (CATHETERS) ×2 IMPLANT
DEVICE RAD COMP TR BAND LRG (VASCULAR PRODUCTS) ×2 IMPLANT
GLIDESHEATH SLEND SS 6F .021 (SHEATH) ×2 IMPLANT
KIT ENCORE 26 ADVANTAGE (KITS) ×2 IMPLANT
KIT HEART LEFT (KITS) ×2 IMPLANT
PACK CARDIAC CATHETERIZATION (CUSTOM PROCEDURE TRAY) ×2 IMPLANT
STENT SYNERGY DES 2.75X20 (Permanent Stent) ×2 IMPLANT
SYR MEDRAD MARK V 150ML (SYRINGE) ×2 IMPLANT
TRANSDUCER W/STOPCOCK (MISCELLANEOUS) ×2 IMPLANT
TUBING CIL FLEX 10 FLL-RA (TUBING) ×2 IMPLANT
WIRE ASAHI PROWATER 180CM (WIRE) ×2 IMPLANT
WIRE SAFE-T 1.5MM-J .035X260CM (WIRE) ×2 IMPLANT

## 2016-01-06 NOTE — Progress Notes (Addendum)
Patient c/o sob, nausea, anxiety throughout shift.  Medicated with some relief.  Also HTNsive.  Given prn meds, caffeine.  Received orders from [place], [place], who was aware of issues.

## 2016-01-06 NOTE — H&P (View-Only) (Signed)
Clinical Summary Edwin Monroe is a 70 y.o.male seen today for follow up of the following medical problems. This is a focused visit on recent chest pain.   1. History of chest pain  - notes indicate remote history of MI in early 21s in [place] at [place]. Transferred to [facility] at that time,cath at that time showed patent coronaries   - recent chest pain. Started a few weeks ago. Midchest sharp pain, 8-10/10. Can occur at rest or with activity. Can radiate to left arm and left jaw. Not positional. No relation to food. +SOB. - DOE walking upstairs,having to stop due to DOE. This is new for him - Pain increasing in frequency since onset - similar to pain in 1991 when he had previous MI    Past Medical History:  Diagnosis Date  . Anxiety disorder   . CAD (coronary artery disease)    REMOTE H/O MI IN 1991  . GERD (gastroesophageal reflux disease)   . Hypertension   . Hypothyroidism      Allergies  Allergen Reactions  . Prednisone Other (See Comments)    Tablet form only per patient (paranoid).  Injection form is fine.      Current Outpatient Prescriptions  Medication Sig Dispense Refill  . aspirin EC 81 MG tablet Take 81 mg by mouth daily.    Marland Kitchen buPROPion (WELLBUTRIN SR) 200 MG 12 hr tablet Take 200 mg by mouth daily.     . Calcium Carbonate-Vitamin D (CALTRATE 600+D PO) Take 1 tablet by mouth 3 (three) times daily.    . carvedilol (COREG) 25 MG tablet Take 1 tablet (25 mg total) by mouth 2 (two) times daily. 180 tablet 3  . cyclobenzaprine (FLEXERIL) 10 MG tablet Take 10 mg by mouth at bedtime.    . folic acid (FOLVITE) 1 MG tablet Take 1 mg by mouth daily.    . furosemide (LASIX) 20 MG tablet Take 20 mg by mouth daily.    Marland Kitchen gabapentin (NEURONTIN) 300 MG capsule Take 1 capsule by mouth at bedtime.  2  . HYDROcodone-acetaminophen (NORCO) 7.5-325 MG per tablet Take 1 tablet by mouth 2 (two) times daily as needed for moderate pain.    Marland Kitchen ibuprofen (ADVIL,MOTRIN) 800 MG  tablet Take 800 mg by mouth 2 (two) times daily.    Marland Kitchen lisinopril (PRINIVIL,ZESTRIL) 40 MG tablet Take 40 mg by mouth daily. 1/2 tab daily    . LORazepam (ATIVAN) 1 MG tablet Take 1 mg by mouth at bedtime.    . Multiple Vitamin (MULTIVITAMIN) tablet Take 1 tablet by mouth daily.    Marland Kitchen omeprazole (PRILOSEC) 20 MG capsule Take 20 mg by mouth daily.    . simvastatin (ZOCOR) 20 MG tablet Take 20 mg by mouth daily. 1/2 tab daily    . terazosin (HYTRIN) 1 MG capsule Take 1 mg by mouth at bedtime.     No current facility-administered medications for this visit.      Past Surgical History:  Procedure Laterality Date  . APPENDECTOMY    . BACK SURGERY     LOW BACK SURGERY X'S 2  . CARDIAC CATHETERIZATION       Allergies  Allergen Reactions  . Prednisone Other (See Comments)    Tablet form only per patient (paranoid).  Injection form is fine.       Family History  Problem Relation Age of Onset  . Heart attack Father     MID 70'S  . Diabetes Mother   .  Diabetes Brother     X'S 2  . Cancer Father   . Stroke Mother      Social History Edwin Monroe reports that he quit smoking about 13 years ago. His smoking use included Cigars and Cigarettes. He smoked 1.00 pack per day. He does not have any smokeless tobacco history on file. Edwin Monroe has no alcohol history on file.   Review of Systems CONSTITUTIONAL: No weight loss, fever, chills, weakness or fatigue.  HEENT: Eyes: No visual loss, blurred vision, double vision or yellow sclerae.No hearing loss, sneezing, congestion, runny nose or sore throat.  SKIN: No rash or itching.  CARDIOVASCULAR: per HPI RESPIRATORY: No shortness of breath, cough or sputum.  GASTROINTESTINAL: No anorexia, nausea, vomiting or diarrhea. No abdominal pain or blood.  GENITOURINARY: No burning on urination, no polyuria NEUROLOGICAL: No headache, dizziness, syncope, paralysis, ataxia, numbness or tingling in the extremities. No change in bowel or bladder  control.  MUSCULOSKELETAL: No muscle, back pain, joint pain or stiffness.  LYMPHATICS: No enlarged nodes. No history of splenectomy.  PSYCHIATRIC: No history of depression or anxiety.  ENDOCRINOLOGIC: No reports of sweating, cold or heat intolerance. No polyuria or polydipsia.  Marland Kitchen   Physical Examination Vitals:   12/30/15 1528  BP: (!) 149/82  Pulse: 77   Vitals:   12/30/15 1528  Weight: 216 lb 12.8 oz (98.3 kg)  Height: 5\' 8"  (1.727 m)    Gen: resting comfortably, no acute distress HEENT: no scleral icterus, pupils equal round and reactive, no palptable cervical adenopathy,  CV Resp: Clear to auscultation bilaterally GI: abdomen is soft, non-tender, non-distended, normal bowel sounds, no hepatosplenomegaly MSK: extremities are warm, no edema.  Skin: warm, no rash Neuro:  no focal deficits Psych: appropriate affect   Diagnostic Studies [facility] EKG 05/2013  NSR, LAE, inferior Q waves   [facility] Cath 05/30/1990  DIAGNOSTIC SUMMARY Coronary Artery Disease RCA system: normal Left Main: normal No significant CAD indicated LAD system: normal Left Ventriculogram LCX system: normal Ejection Fraction: 71%  COMMENT The LAD curls the Apex. The RCA and Cirx arteries both contribute small PDA's.    07/2013 Echo  LVEF 45-50%, mild LVH, grade I diastolic dysfunction, hypokinesis of the apical anterior and apical latera walls.     Assessment and Plan  1. Chest pain - recent symptoms concerning for angina. EKG in clinic shows SR without acute ischemic changes.  - echo 2015 with low normal to mildly decreased LVEF, some evidence of apical hypokinesis. In the absence of symptoms at that time he was medically managed - based on high suspicion of CAD based on symptoms and prior echo findings, we will plan for LHC to further evaluate - start SL NG.    F/u 1 month  I have reviewed the risks, indications, and alternatives to cardiac catheterization, possible angioplasty, and  stenting with the patient. Risks include but are not limited to bleeding, infection, vascular injury, stroke, myocardial infection, arrhythmia, kidney injury, radiation-related injury in the case of prolonged fluoroscopy use, emergency cardiac surgery, and death. The patient understands the risks of serious complication is 1-2 in [id number] with diagnostic cardiac cath and 1-2% or less with angioplasty/stenting.     Arnoldo Lenis, M.D.

## 2016-01-06 NOTE — Care Management Note (Addendum)
Case Management Note  Patient Details  Name: Edwin Monroe MRN: [id number] Date of Birth: 1945-09-02  Subjective/Objective:     Patient is from home, s/p coronary stent intervention, will be on Brilinta, NCM checking benefits.   Per [facility] check[facility] [place] [facility]  And they take the 30 day savings card.  Patient for dc on 8/11.      S/W LATONYA @ [facility] # [id number]   BRILINTA 90 MG BID ( 30 )  COVER- YES  CO-PAY- $ 18.00  60 TAC  TIER- 2 DRUG  PRIOR APPROVAL -NO  PH[facility]ACY : [facility] , [facility], [facility], [facility]   MIL -ORDER 90 DAY SUPPLY CO-PY- $36.00              Action/Plan:   Expected Discharge Date:                  Expected Discharge Plan:  Home/Self Care  In-House Referral:     Discharge planning Services  CM Consult  Post Acute Care Choice:    Choice offered to:     DME Arranged:    DME Agency:     HH Arranged:    [place]:     Status of Service:  Completed, signed off  If discussed at [employer], dates discussed:    Additional Comments:  Zenon Mayo, RN 01/06/2016, 12:45 PM

## 2016-01-06 NOTE — Interval H&P Note (Signed)
Cath Lab Visit (complete for each Cath Lab visit)  Clinical Evaluation Leading to the Procedure:   ACS: No.  Non-ACS:    Anginal Classification: CCS III  Anti-ischemic medical therapy: Maximal Therapy (2 or more classes of medications)  Non-Invasive Test Results: No non-invasive testing performed  Prior CABG: No previous CABG      History and Physical Interval Note:  01/06/2016 8:08 AM  Edwin Monroe  has presented today for surgery, with the diagnosis of cp  The various methods of treatment have been discussed with the patient and family. After consideration of risks, benefits and other options for treatment, the patient has consented to  Procedure(s): Left Heart Cath and Coronary Angiography (N/A) as a surgical intervention .  The patient's history has been reviewed, patient examined, no change in status, stable for surgery.  I have reviewed the patient's chart and labs.  Questions were answered to the patient's satisfaction.     Shelva Majestic

## 2016-01-07 ENCOUNTER — Encounter (HOSPITAL_COMMUNITY): Payer: Self-pay | Admitting: Physician Assistant

## 2016-01-07 DIAGNOSIS — I252 Old myocardial infarction: Secondary | ICD-10-CM | POA: Diagnosis not present

## 2016-01-07 DIAGNOSIS — I200 Unstable angina: Secondary | ICD-10-CM | POA: Diagnosis not present

## 2016-01-07 DIAGNOSIS — E039 Hypothyroidism, unspecified: Secondary | ICD-10-CM | POA: Insufficient documentation

## 2016-01-07 DIAGNOSIS — I25110 Atherosclerotic heart disease of native coronary artery with unstable angina pectoris: Secondary | ICD-10-CM | POA: Diagnosis not present

## 2016-01-07 DIAGNOSIS — F419 Anxiety disorder, unspecified: Secondary | ICD-10-CM | POA: Insufficient documentation

## 2016-01-07 DIAGNOSIS — R7303 Prediabetes: Secondary | ICD-10-CM | POA: Diagnosis not present

## 2016-01-07 DIAGNOSIS — K219 Gastro-esophageal reflux disease without esophagitis: Secondary | ICD-10-CM | POA: Insufficient documentation

## 2016-01-07 DIAGNOSIS — I10 Essential (primary) hypertension: Secondary | ICD-10-CM | POA: Diagnosis not present

## 2016-01-07 LAB — BASIC METABOLIC PANEL
Anion gap: 8 (ref 5–15)
BUN: 6 mg/dL (ref 6–20)
CHLORIDE: 106 mmol/L (ref 101–111)
CO2: 25 mmol/L (ref 22–32)
Calcium: 8.9 mg/dL (ref 8.9–10.3)
Creatinine, Ser: 0.7 mg/dL (ref 0.61–1.24)
GFR calc non Af Amer: >60 mL/min (ref >60)
Glucose, Bld: 134 mg/dL — ABNORMAL HIGH (ref 65–99)
POTASSIUM: 3.5 mmol/L (ref 3.5–5.1)
SODIUM: 139 mmol/L (ref 135–145)

## 2016-01-07 LAB — HEPATIC FUNCTION PANEL
ALT: 18 U/L (ref 17–63)
AST: 13 U/L — AB (ref 15–41)
Albumin: 3.4 g/dL — ABNORMAL LOW (ref 3.5–5.0)
Alkaline Phosphatase: 61 U/L (ref 38–126)
BILIRUBIN TOTAL: 0.4 mg/dL (ref 0.3–1.2)
Total Protein: 6.5 g/dL (ref 6.5–8.1)

## 2016-01-07 LAB — CBC
HEMATOCRIT: 43.6 % (ref 39.0–52.0)
HEMOGLOBIN: 14.2 g/dL (ref 13.0–17.0)
MCH: 29.5 pg (ref 26.0–34.0)
MCHC: 32.6 g/dL (ref 30.0–36.0)
MCV: 90.6 fL (ref 78.0–100.0)
Platelets: 241 10*3/uL (ref 150–400)
RBC: 4.81 MIL/uL (ref 4.22–5.81)
RDW: 12.9 % (ref 11.5–15.5)
WBC: 9.5 10*3/uL (ref 4.0–10.5)

## 2016-01-07 MED ORDER — FUROSEMIDE 20 MG PO TABS
20.0000 mg | ORAL_TABLET | Freq: Every day | ORAL | Status: DC
  Administered 2016-01-07: 09:00:00 20 mg via ORAL
  Filled 2016-01-07: qty 1

## 2016-01-07 MED ORDER — POTASSIUM CHLORIDE CRYS ER 20 MEQ PO TBCR
40.0000 meq | EXTENDED_RELEASE_TABLET | Freq: Once | ORAL | Status: AC
  Administered 2016-01-07: 09:00:00 40 meq via ORAL
  Filled 2016-01-07: qty 2

## 2016-01-07 MED ORDER — ASPIRIN EC 81 MG PO TBEC
81.0000 mg | DELAYED_RELEASE_TABLET | Freq: Every day | ORAL | Status: DC
  Administered 2016-01-07: 09:00:00 81 mg via ORAL
  Filled 2016-01-07: qty 1

## 2016-01-07 MED ORDER — ALUM & MAG HYDROXIDE-SIMETH 200-200-20 MG/5ML PO SUSP
30.0000 mL | Freq: Four times a day (QID) | ORAL | Status: DC | PRN
  Administered 2016-01-07: 12:00:00 30 mL via ORAL
  Filled 2016-01-07: qty 30

## 2016-01-07 MED ORDER — ATORVASTATIN CALCIUM 80 MG PO TABS: 80.0000 mg | ORAL_TABLET | Freq: Every day | ORAL | Status: DC

## 2016-01-07 MED ORDER — ASPIRIN EC 81 MG PO TBEC: 81.0000 mg | DELAYED_RELEASE_TABLET | Freq: Every day | ORAL | Status: DC

## 2016-01-07 MED ORDER — ATORVASTATIN CALCIUM 80 MG PO TABS: 80.0000 mg | ORAL_TABLET | Freq: Every evening | ORAL | 6 refills | Status: DC

## 2016-01-07 MED ORDER — TICAGRELOR 90 MG PO TABS: 90.0000 mg | ORAL_TABLET | Freq: Two times a day (BID) | ORAL | 3 refills | Status: DC

## 2016-01-07 NOTE — Progress Notes (Signed)
Patient: Edwin Monroe / Admit Date: 01/06/2016 / Date of Encounter: 01/07/2016, 7:43 AM   Subjective: Feeling well. No CP or SOB.  Objective: Telemetry: NSR Physical Exam: Blood pressure (!) 146/90, pulse 74, temperature 97 F (36.1 C), temperature source Oral, resp. rate (!) 25, height 5\' 8"  (1.727 m), weight 215 lb 2.7 oz (97.6 kg), SpO2 100 %. General: Well developed, well nourished obese M in no acute distress. Head: Normocephalic, atraumatic, sclera non-icteric, no xanthomas, nares are without discharge. Neck: Negative for carotid bruits. JVP not elevated. Lungs: Clear bilaterally to auscultation without wheezes, rales, or rhonchi. Breathing is unlabored. Heart: RRR S1 S2 without murmurs, rubs, or gallops.  Abdomen: Soft, non-tender, non-distended with normoactive bowel sounds. No rebound/guarding. Extremities: No clubbing or cyanosis. No edema. Distal pedal pulses are 2+ and equal bilaterally. Right radial cath site without hematoma or ecchymosis; good pulse. Neuro: Alert and oriented X 3. Moves all extremities spontaneously. Psych:  Responds to questions appropriately with a normal affect.   Intake/Output Summary (Last 24 hours) at 01/07/16 0743 Last data filed at 01/07/16 0200  Gross per 24 hour  Intake             2705 ml  Output             2475 ml  Net              230 ml    Inpatient Medications:  . aspirin  81 mg Oral Daily  . carvedilol  25 mg Oral BID  . cyclobenzaprine  10 mg Oral QHS  . gabapentin  300 mg Oral QHS  . nisoldipine  17 mg Oral Daily  . pantoprazole  40 mg Oral Daily  . sodium chloride flush  3 mL Intravenous Q12H  . terazosin  1 mg Oral QHS  . ticagrelor  90 mg Oral BID   Infusions:  . sodium chloride Stopped (01/07/16 0230)    Labs:  Recent Labs  01/06/16 0708 01/07/16 0508  NA 139 139  K 3.8 3.5  CL 102 106  CO2 26 25  GLUCOSE 137* 134*  BUN 16 6  CREATININE 0.81 0.70  CALCIUM 9.2 8.9    Recent Labs  01/06/16 0708  01/07/16 0508  WBC 7.6 9.5  HGB 14.0 14.2  HCT 42.6 43.6  MCV 90.4 90.6  PLT 229 241   No results for input(s): CKTOTAL, CKMB, TROPONINI in the last 72 hours. Invalid input(s): POCBNP No results for input(s): HGBA1C in the last 72 hours.   Radiology/Studies:  No results found.   Assessment and Plan  55M with CAD (remote MI 1991 with [place] cath at that time), prior cardiomyopathy (EF 45-50% in 2015), GERD, HTN, hypothyroidism presented to [facility] yesterday for planned LHC. He was recently seen as outpatient with sx consistent with unstable angina. LHC 01/06/16:  PTCA/DES to mRCA, left coronary system OK, LVEF 55-65%.   1. Unstable angina/CAD - continue ASA/Brilinta. Continue BB. Check baseline LFTs, Add statin. Can check lipids as OP.  2. HTN - labile BP. Will review regimen with MD. BP 149/82 in the office. He is on Lasix as OP, not placed on it here.  3. Prior cardiomyopathy - LVEF improved by cath. Continue BB.  Signed, Melina Copa PA-C Pager: [phone]  I have personally seen and examined this patient with Melina Copa, PA-C. I agree with the assessment and plan as outlined above. He was admitted following cath/PCI. Recent chest pain c/w unstable angina. Cath yesterday with severe stenosis  RCA. DES x 1 mid RCA. Exam shows well developed male, NAD. CV RRR with no murmurs. Lungs clear. No LE edema. Will continue ASA and Brilinta, beta blocker and statin. Discharge home today. Follow up Dr. Harl Bowie as planned in several weeks.   Lauree Chandler 01/07/2016 8:17 AM

## 2016-01-07 NOTE — Progress Notes (Signed)
CARDIAC REHAB PHASE I   PRE:  Rate/Rhythm: 86 SR  BP:  Supine: 172/84  Sitting:   Standing:    SaO2:   MODE:  Ambulation: 1000 ft   POST:  Rate/Rhythm: 114 ST  BP:  Supine:   Sitting: 162/85  Standing:    SaO2: 97%RA 0815-0910 Pt walked 1000 ft with steady gait. No CP. Tolerated well. Education completed with pt who voiced understanding. Stressed importance of brilinta with stent. Reviewed NTG use, heart healthy diet, ex ed and CRP 2. Will refer to [facility] program.   Graylon Good, RN BSN  01/07/2016 9:06 AM

## 2016-01-07 NOTE — Progress Notes (Signed)
TR BAND REMOVAL  LOCATION:  right radial  DEFLATED PER PROTOCOL:  Yes.    TIME BAND OFF / DRESSING APPLIED:   1400   SITE UPON ARRIVAL:   Level 0  SITE AFTER BAND REMOVAL:  Level 0  CIRCULATION SENSATION AND MOVEMENT:  Within Normal Limits  Yes.    COMMENTS:    

## 2016-01-07 NOTE — Discharge Summary (Signed)
Discharge Summary    Patient ID: Edwin Monroe,  MRN: [id number], DOB/AGE: 1946/05/10 70 y.o.  Admit date: 01/06/2016 Discharge date: 01/07/2016  Primary Care Provider: Joaquim Lai T Primary Cardiologist: Dr. Harl Bowie  Discharge Diagnoses    Principal Problem:   Unstable angina [facility]) Active Problems:   CAD in native artery   HTN (hypertension)   OSA (obstructive sleep apnea)   Prediabetes    Diagnostic Studies/Procedures    1. Cardiac catheterization this admission, please see full report and below for summary. _____________   History of Present Illness & Hospital Course    [name] is a 70 y/o M with history of possible CAD (remote MI 55 with [place] cath at that time), prior cardiomyopathy (EF 45-50% in 2015), GERD, HTN, hypothyroidism who presented to [facility] yesterday for planned LHC. He was recently seen as outpatient with sx consistent with unstable angina. LHC 01/06/16:  PTCA/DES to mRCA, left coronary system OK, LVEF 55-65%. He tolerated this procedure well. He was started on ASA/Brilinta as well as Lipitor. Baseline LFTs are pending at time of DC. If the patient is tolerating statin at time of follow-up appointment, would consider rechecking liver function/lipid panel in 6-8 weeks. Dr. Angelena Form has seen and examined the patient today and feels he is stable for discharge. He was also advised to f/u PCP for monitoring of blood sugar given CBG 130's and h/o pre-DM. The patient's BP was somewhat labile this admission. Lasix was held on admission (BP 97-1teens on admission). Today it is starting to creep up. Lasix will be resumed. He was given 3meq of KCl prior to DC for K of 3.5. Admit K was 3.8 so he may not need standing potassium at this time - was advised to increase oral intake of potassium rich foods. The patient expressed preference not to change any BP meds at this time. He was advised to follow BP at home and call if running 70 systolic. We will make sure BP is coming  down before official discharge.  He uses mail order pharmacy. I gave him 30-day free rx for Brilinta locally and [facility]. I sent in 90 day supply + refills of Brilinta to mail order. If he is doing well on statin in f/u, this RX will need to be sent to mail order.  _____________  Discharge Vitals Blood pressure (!) 172/84, pulse 95, temperature 98.6 F (37 C), temperature source Oral, resp. rate (!) 23, height 5\' 8"  (1.727 m), weight 215 lb 2.7 oz (97.6 kg), SpO2 97 %.  Filed Weights   01/06/16 0645 01/07/16 0414  Weight: 216 lb (98 kg) 215 lb 2.7 oz (97.6 kg)    Labs & Radiologic Studies    CBC  Recent Labs  01/06/16 0708 01/07/16 0508  WBC 7.6 9.5  HGB 14.0 14.2  HCT 42.6 43.6  MCV 90.4 90.6  PLT 229 [id number]   Basic Metabolic Panel  Recent Labs  01/06/16 0708 01/07/16 0508  NA 139 139  K 3.8 3.5  CL 102 106  CO2 26 25  GLUCOSE 137* 134*  BUN 16 6  CREATININE 0.81 0.70  CALCIUM 9.2 8.9   Liver Function Tests  Recent Labs  01/07/16 0630  AST 13*  ALT 18  ALKPHOS 61  BILITOT 0.4  PROT 6.5  ALBUMIN 3.4*   ____________  No results found. Disposition   Pt is being discharged home today in good condition.  Follow-up Plans & Appointments    Follow-up Information  Joseph Art, MD .   Specialty:  Internal Medicine Why:  Your blood sugar was elevated and this needs to be further monitored given your history of pre-diabetes. Contact information: [id number] [place] [address] [phone]        Carlyle Dolly, MD Follow up on 02/04/2016.   Specialty:  Cardiology Why:  At [facility] information: [place] [address] [phone]          Discharge Instructions    Diet - low sodium heart healthy    Complete by:  As directed   Increase activity slowly    Complete by:  As directed   You will fill your 30-day free Brilinta as well as your atorvastatin prescription  locally. The Brilinta has also been sent in to your mail-order. If you are doing well on atorvastatin at follow-up, please ask your doctor to send this to mail order as well.  Please monitor your blood pressure occasionally at home. Call your doctor if you tend to get readings of greater than 130 on the top number or 80 on the bottom number.  Please increase dietary intake of healthy sources of dietary intake of potassium including bananas, squash, yogurt, white beans, sweet potatoes, leafy greens, and avocados.  No driving for 2 days. No lifting over 5 lbs for 1 week. No sexual activity for 1 week.  Keep procedure site clean & dry. If you notice increased pain, swelling, bleeding or pus, call/return!  You may shower, but no soaking baths/hot tubs/pools for 1 week.   Patients taking blood thinners should generally stay away from medicines like ibuprofen, Advil, Motrin, naproxen, and Aleve due to risk of stomach bleeding. You may take Tylenol as directed or talk to your primary doctor about alternatives.      Discharge Medications     Medication List    STOP taking these medications   ibuprofen 800 MG tablet Commonly known as:  ADVIL,MOTRIN     TAKE these medications   aspirin EC 81 MG tablet Take 81 mg by mouth daily.   atorvastatin 80 MG tablet Commonly known as:  LIPITOR Take 1 tablet (80 mg total) by mouth every evening.   CALTRATE 600+D PO Take 1 tablet by mouth daily.   carvedilol 25 MG tablet Commonly known as:  COREG Take 1 tablet (25 mg total) by mouth 2 (two) times daily.   cyclobenzaprine 10 MG tablet Commonly known as:  FLEXERIL Take 10 mg by mouth at bedtime.   furosemide 20 MG tablet Commonly known as:  LASIX Take 20 mg by mouth daily.   gabapentin 300 MG capsule Commonly known as:  NEURONTIN Take 1 capsule by mouth at bedtime.   HYDROcodone-acetaminophen 7.5-325 MG tablet Commonly known as:  NORCO Take 1 tablet by mouth 2 (two) times daily as needed  for moderate pain.   LORazepam 1 MG tablet Commonly known as:  ATIVAN Take 1 mg by mouth at bedtime.   multivitamin tablet Take 1 tablet by mouth daily.   nisoldipine 17 MG 24 hr tablet Commonly known as:  SULAR Take 17 mg by mouth daily.   nitroGLYCERIN 0.4 MG SL tablet Commonly known as:  NITROSTAT Place 1 tablet (0.4 mg total) under the tongue every 5 (five) minutes as needed for chest pain.   omeprazole 20 MG capsule Commonly known as:  PRILOSEC Take 20 mg by mouth daily.   terazosin 1 MG capsule Commonly known as:  HYTRIN Take  1 mg by mouth at bedtime.   ticagrelor 90 MG Tabs tablet Commonly known as:  BRILINTA Take 1 tablet (90 mg total) by mouth 2 (two) times daily.   VENTOLIN HFA IN Inhale 1 puff into the lungs every 6 (six) hours as needed (wheezing).   Vitamin D3 2000 units Tabs Take 1 tablet by mouth daily.       Allergies:  Allergies  Allergen Reactions  . Prednisone Other (See Comments)    Tablet form only per patient (paranoid).  Injection form is fine.     Outstanding Labs/Studies   NA  Duration of Discharge Encounter   Greater than 30 minutes including physician time.  Signed, Charlie Pitter PA-C 01/07/2016, 8:26 AM

## 2016-01-11 ENCOUNTER — Telehealth: Payer: Self-pay | Admitting: Cardiology

## 2016-01-11 NOTE — Telephone Encounter (Signed)
Pt aware and will contact his pcp

## 2016-01-11 NOTE — Telephone Encounter (Signed)
He needs to see his pcp. Typically a drug rash would be all over, not just isolated in one spot. Depending what his pcp thinks, we can consider med changes if we have to   Zandra Abts MD

## 2016-01-11 NOTE — Telephone Encounter (Signed)
[facility] thinks he is having an allgeric reaction to medication

## 2016-01-11 NOTE — Telephone Encounter (Signed)
Pt says he has developed red circle type rash on his side that is crusted over.  Pt was recently but on Brilinta and Atorvastatin after d/c from cath and wanted to make sure rash wasn't coming from new medications. Pt advised to use anti fungal cream since this presents like ringworm. Pt denies rash anywhere else and denies any symptoms since starting new medications. Will forward to Dr Harl Bowie for further recommendation.

## 2016-01-20 ENCOUNTER — Ambulatory Visit: Payer: Medicare Other | Admitting: Cardiology

## 2016-01-26 ENCOUNTER — Telehealth: Payer: Self-pay | Admitting: Cardiology

## 2016-01-26 NOTE — Telephone Encounter (Signed)
Edwin Monroe is calling today stating that he had a stent placed 01-07-16.  States that he was told not to take Ibuprofen.  Patient states that he is experiencing a lot of pain scale 1-10 patient states # 10 for pain.   [phone]

## 2016-01-26 NOTE — Telephone Encounter (Signed)
Pt c/o back pain (has had chronic back problems) says he was told not to take ibuprofen - says he has been taking Tylenol but this hasn't helped. Says he contacted pcp and was told to call cardiologist. Pt says he used to see pain management clinic. Suggested pt contact pain clinic for appt but we would only recommend Tylenol and could not give pain medication for his back. Pt voiced understanding

## 2016-02-04 ENCOUNTER — Encounter: Payer: Self-pay | Admitting: Cardiology

## 2016-02-04 ENCOUNTER — Ambulatory Visit (INDEPENDENT_AMBULATORY_CARE_PROVIDER_SITE_OTHER): Payer: Medicare Other | Admitting: Cardiology

## 2016-02-04 VITALS — BP 130/77 | HR 83 | Ht 68.0 in | Wt 211.0 lb

## 2016-02-04 DIAGNOSIS — E785 Hyperlipidemia, unspecified: Secondary | ICD-10-CM

## 2016-02-04 DIAGNOSIS — I10 Essential (primary) hypertension: Secondary | ICD-10-CM

## 2016-02-04 DIAGNOSIS — I2510 Atherosclerotic heart disease of native coronary artery without angina pectoris: Secondary | ICD-10-CM

## 2016-02-04 MED ORDER — LOSARTAN POTASSIUM 25 MG PO TABS: 25.0000 mg | ORAL_TABLET | Freq: Every day | ORAL | 6 refills | Status: DC

## 2016-02-04 NOTE — Progress Notes (Signed)
Clinical Summary Mr. Ludington is a 70 y.o.male seen today for follow up of the following medical problems.   1. CAD - notes indicate remote history of MI in early 33s in [place] at [place]. Transferred to [facility] at that time,cath at that time showed patent coronaries  - last visit referred for cath due to chest pain. Received DES to RCA.  - can still have some SOB, no recent chest pain. DOE at 1 block.  - compliant with meds - he is to start cardiac rehab soon.  - he reports ACE-I stopped to cough in the past   2. HTN  - compliant with meds   3. OSA  - poor compliance with CPAP  4. Mild to low normal LV systolic function - echo [id number] LVEF 45-50%, some apical hypokinesis and grade I diastolic dysfunction - no recent LE edema. No SOB or DOE.   5. Hyperlipidemia - compliant with statin -labs followed by [place].   6. DM2 - followed by pcp Past Medical History:  Diagnosis Date  . Agent orange exposure [id number]  . Anxiety disorder   . Arthritis    "back, knees, shoulders" (01/06/2016)  . Asthma   . CAD (coronary artery disease)    a. remote MI 37 with OK cath at that time. b. LHC 01/06/16:  PTCA/DES to mRCA, left coronary system OK, LVEF 55-65%.   . Cardiomyopathy ([place])    a. EF 45-50% in 2015. b. LVEF 55-65% in 2017.  Marland Kitchen Chronic lower back pain   . GERD (gastroesophageal reflux disease)   . Hypertension   . Hypothyroidism    "got nuclear treatment in the 70's"  . Myocardial infarction ([place]) 04/1990    in [place] at [place]/notes 08/21/2013  . OSA on CPAP   . Prediabetes      Allergies  Allergen Reactions  . Prednisone Other (See Comments)    Tablet form only per patient (paranoid).  Injection form is fine.      Current Outpatient Prescriptions  Medication Sig Dispense Refill  . Albuterol Sulfate (VENTOLIN HFA IN) Inhale 1 puff into the lungs every 6 (six) hours as needed (wheezing).     Marland Kitchen aspirin EC 81 MG tablet Take 81 mg by mouth daily.    Marland Kitchen  atorvastatin (LIPITOR) 80 MG tablet Take 1 tablet (80 mg total) by mouth every evening. 30 tablet 6  . Calcium Carbonate-Vitamin D (CALTRATE 600+D PO) Take 1 tablet by mouth daily.     . carvedilol (COREG) 25 MG tablet Take 1 tablet (25 mg total) by mouth 2 (two) times daily. 180 tablet 3  . Cholecalciferol (VITAMIN D3) 2000 units TABS Take 1 tablet by mouth daily.    . cyclobenzaprine (FLEXERIL) 10 MG tablet Take 10 mg by mouth at bedtime.    . furosemide (LASIX) 20 MG tablet Take 20 mg by mouth daily.    Marland Kitchen gabapentin (NEURONTIN) 300 MG capsule Take 1 capsule by mouth at bedtime.  2  . HYDROcodone-acetaminophen (NORCO) 7.5-325 MG per tablet Take 1 tablet by mouth 2 (two) times daily as needed for moderate pain.    Marland Kitchen LORazepam (ATIVAN) 1 MG tablet Take 1 mg by mouth at bedtime.    . Multiple Vitamin (MULTIVITAMIN) tablet Take 1 tablet by mouth daily.    . nisoldipine (SULAR) 17 MG 24 hr tablet Take 17 mg by mouth daily.    . nitroGLYCERIN (NITROSTAT) 0.4 MG SL tablet Place 1 tablet (0.4 mg total) under the tongue  every 5 (five) minutes as needed for chest pain. 25 tablet 3  . omeprazole (PRILOSEC) 20 MG capsule Take 20 mg by mouth daily.    Marland Kitchen terazosin (HYTRIN) 1 MG capsule Take 1 mg by mouth at bedtime.    . ticagrelor (BRILINTA) 90 MG TABS tablet Take 1 tablet (90 mg total) by mouth 2 (two) times daily. 180 tablet 3   No current facility-administered medications for this visit.      Past Surgical History:  Procedure Laterality Date  . APPENDECTOMY  1948  . BACK SURGERY    . CARDIAC CATHETERIZATION  05/1990  . CARDIAC CATHETERIZATION N/A 01/06/2016   Procedure: Left Heart Cath and Coronary Angiography;  Surgeon: Troy Sine, MD;  Location: [facility];  Service: Cardiovascular;  Laterality: N/A;  . CARDIAC CATHETERIZATION N/A 01/06/2016   Procedure: Coronary Stent Intervention;  Surgeon: Troy Sine, MD;  Location: [facility];  Service: Cardiovascular;  Laterality:  N/A;  RCA mid  . CATARACT EXTRACTION W/ INTRAOCULAR LENS  IMPLANT, BILATERAL Bilateral   . POSTERIOR LUMBAR FUSION  08/1982; 05/1996     Allergies  Allergen Reactions  . Prednisone Other (See Comments)    Tablet form only per patient (paranoid).  Injection form is fine.       Family History  Problem Relation Age of Onset  . Heart attack Father     MID 70'S  . Cancer Father   . Diabetes Mother   . Stroke Mother   . Diabetes Brother     X'S 2     Social History Mr. Thane reports that he quit smoking about 13 years ago. His smoking use included Cigars. He quit after 35.00 years of use. He has never used smokeless tobacco. Mr. Romberger reports that he drinks alcohol.   Review of Systems CONSTITUTIONAL: No weight loss, fever, chills, weakness or fatigue.  HEENT: Eyes: No visual loss, blurred vision, double vision or yellow sclerae.No hearing loss, sneezing, congestion, runny nose or sore throat.  SKIN: No rash or itching.  CARDIOVASCULAR: per HPI RESPIRATORY: No shortness of breath, cough or sputum.  GASTROINTESTINAL: No anorexia, nausea, vomiting or diarrhea. No abdominal pain or blood.  GENITOURINARY: No burning on urination, no polyuria NEUROLOGICAL: No headache, dizziness, syncope, paralysis, ataxia, numbness or tingling in the extremities. No change in bowel or bladder control.  MUSCULOSKELETAL: No muscle, back pain, joint pain or stiffness.  LYMPHATICS: No enlarged nodes. No history of splenectomy.  PSYCHIATRIC: No history of depression or anxiety.  ENDOCRINOLOGIC: No reports of sweating, cold or heat intolerance. No polyuria or polydipsia.  Marland Kitchen   Physical Examination Vitals:   02/04/16 0951  BP: 130/77  Pulse: 83   Vitals:   02/04/16 0951  Weight: 211 lb (95.7 kg)  Height: 5\' 8"  (1.727 m)    Gen: resting comfortably, no acute distress HEENT: no scleral icterus, pupils equal round and reactive, no palptable cervical adenopathy,  CV: RRR, no m/r/g, no  jvd Resp: Clear to auscultation bilaterally GI: abdomen is soft, non-tender, non-distended, normal bowel sounds, no hepatosplenomegaly MSK: extremities are warm, no edema.  Skin: warm, no rash Neuro:  no focal deficits Psych: appropriate affect   Diagnostic Studies [facility] EKG 05/2013 NSR, LAE, inferior Q waves   [facility] Cath 05/30/1990 DIAGNOSTIC SUMMARY Coronary Artery Disease RCA system: normal Left Main: normal No significant CAD indicated LAD system: normal Left Ventriculogram LCX system: normal Ejection Fraction: 71%  COMMENT The LAD curls the Apex. The RCA  and Cirx arteries both contribute small PDA's.   07/2013 Echo LVEF 45-50%, mild LVH, grade I diastolic dysfunction, hypokinesis of the apical anterior and apical latera walls.    12/2015 cath  Mid RCA-1 lesion, 30 %stenosed.  A STENT SYNERGY DES 2.75X20 drug eluting stent was successfully placed.  Mid RCA-2 lesion, 80 %stenosed.  Post intervention, there is a 0% residual stenosis.  The left ventricular systolic function is normal.  LV end diastolic pressure is normal.  The left ventricular ejection fraction is 55-65% by visual estimate.  There is no mitral valve regurgitation.   Normal LV function with an ejection fraction of approximately 55-60%. LVEDP 13 mm Hg.  Normal left coronary coronary circulation with a large LAD and circumflex vessel and diminutive ramus intermediate vessel.  Single vessel CAD with 30% followed by 80% mid RCA stenosis.  Successful PCI with PTCA/DES stenting of the mid RCA with ultimate insertion of a 2.7x 20 mm Synergy DES stent postdilated to 2.92 mm with the stenoses being reduced to 0%.  Postcath RECOMMENDATION: The patient will continue with dual antiplatelet and medical therapy.  Aggressive lipid intervention with a target LDL less than 70 will be undertaken.  The patient will follow up with Dr. Carlyle Dolly.    Assessment and Plan   1. CAD - DES to RCA  01/06/16, continue DAPT at least until 12/2016 - some SOB suspect related to deconditioning, follow his progress when he starts cardaic rehab.  - we will start low dose ARB in setting of known CAD for additional cardiovascular benefits   2. HTN  - at goal, we will continue current meds. State losartan 25mg  daily as described above.   3. OSA  - continue CPAP  4. Hyperlipidemia - defer management to [facility] who have been checking his labs regularly  F/u 6 months      Arnoldo Lenis, M.D.

## 2016-02-04 NOTE — Patient Instructions (Addendum)
Medication Instructions:   Begin Losartan 25mg daily.  Continue all other medications.    Labwork: none  Testing/Procedures: none  Follow-Up: Your physician wants you to follow up in: 6 months.  You will receive a reminder letter in the mail one-two months in advance.  If you don't receive a letter, please call our office to schedule the follow up appointment.  Any Other Special Instructions Will Be Listed Below (If Applicable).  If you need a refill on your cardiac medications before your next appointment, please call your pharmacy.  

## 2016-02-09 ENCOUNTER — Encounter: Payer: Self-pay | Admitting: *Deleted

## 2016-07-27 ENCOUNTER — Other Ambulatory Visit: Payer: Self-pay | Admitting: Physician Assistant

## 2016-08-15 ENCOUNTER — Encounter: Payer: Self-pay | Admitting: *Deleted

## 2016-08-15 ENCOUNTER — Ambulatory Visit (INDEPENDENT_AMBULATORY_CARE_PROVIDER_SITE_OTHER): Payer: Medicare Other | Admitting: Cardiology

## 2016-08-15 ENCOUNTER — Encounter: Payer: Self-pay | Admitting: Cardiology

## 2016-08-15 ENCOUNTER — Telehealth: Payer: Self-pay | Admitting: Cardiology

## 2016-08-15 VITALS — BP 118/73 | HR 67 | Ht 65.0 in | Wt 215.0 lb

## 2016-08-15 DIAGNOSIS — I10 Essential (primary) hypertension: Secondary | ICD-10-CM

## 2016-08-15 DIAGNOSIS — I2510 Atherosclerotic heart disease of native coronary artery without angina pectoris: Secondary | ICD-10-CM

## 2016-08-15 DIAGNOSIS — R079 Chest pain, unspecified: Secondary | ICD-10-CM

## 2016-08-15 DIAGNOSIS — E782 Mixed hyperlipidemia: Secondary | ICD-10-CM

## 2016-08-15 NOTE — Telephone Encounter (Signed)
Pre-cert Verification for the following procedure   Exercise CL - chest pain scheduled for 08/23/16 at [facility]

## 2016-08-15 NOTE — Patient Instructions (Signed)
Medication Instructions:  Continue all current medications.  Labwork: none  Testing/Procedures:  Your physician has requested that you have en exercise stress myoview. For further information please visit [email or web address]. Please follow instruction sheet, as given.  Office will contact with results via phone or letter.    Follow-Up: 6 weeks   Any Other Special Instructions Will Be Listed Below (If Applicable).  If you need a refill on your cardiac medications before your next appointment, please call your pharmacy.

## 2016-08-15 NOTE — Progress Notes (Signed)
Clinical Summary Mr. Hauss is a 71 y.o.male seen today for follow up of the following medical problems.   1. CAD - notes indicate remote history of MI in early 25s in [place] at [place]. Transferred to [facility] at that time,cath at that time showed patent coronaries -  Received DES to RCA 12/2015. LVEF by LVgram at that time 55-65%  - some chest pain last week. Sharp pain left sided, up to 8/10 in severity. Mainly at rest. +SOB. Would last few minutes, resolve and reoccur. Not positional. Felt some chest pressure. No relation to food. First pain since stent was placed. No episodes this week.  - completed cardiac rehab    2. HTN  - he remains compliant with meds   3. OSA  - poor compliance [facility]   4. Hyperlipidemia - compliant with statin -[facility] by [facility].  - 01/2016 LDL 18 HDL 33 TG 90 TC 69  5. DM2 - followed by pcp   Past Medical History:  Diagnosis Date  . Agent orange exposure [id number]  . Anxiety disorder   . Arthritis    "back, knees, shoulders" (01/06/2016)  . Asthma   . CAD (coronary artery disease)    a. remote MI 54 with OK cath at that time. b. LHC 01/06/16:  PTCA/DES to mRCA, left coronary system OK, LVEF 55-65%.   . Cardiomyopathy ([place])    a. EF 45-50% in 2015. b. LVEF 55-65% in 2017.  Marland Kitchen Chronic lower back pain   . GERD (gastroesophageal reflux disease)   . Hypertension   . Hypothyroidism    "got nuclear treatment in the 70's"  . Myocardial infarction 04/1990    in [place] at [place]/notes 08/21/2013  . OSA on CPAP   . Prediabetes      Allergies  Allergen Reactions  . Prednisone Other (See Comments)    Tablet form only per patient (paranoid).  Injection form is fine.      Current Outpatient Prescriptions  Medication Sig Dispense Refill  . Albuterol Sulfate (VENTOLIN HFA IN) Inhale 1 puff into the lungs every 6 (six) hours as needed (wheezing).     Marland Kitchen aspirin EC 81 MG tablet Take 81 mg by mouth daily.    Marland Kitchen  atorvastatin (LIPITOR) 80 MG tablet TAKE 1 TABLET (80 MG TOTAL) BY MOUTH EVERY EVENING. 30 tablet 0  . Calcium Carbonate-Vitamin D (CALTRATE 600+D PO) Take 1 tablet by mouth daily.     . carvedilol (COREG) 25 MG tablet Take 1 tablet (25 mg total) by mouth 2 (two) times daily. 180 tablet 3  . Cholecalciferol (VITAMIN D3) 2000 units TABS Take 1 tablet by mouth daily.    . cyclobenzaprine (FLEXERIL) 10 MG tablet Take 10 mg by mouth at bedtime.    . furosemide (LASIX) 20 MG tablet Take 20 mg by mouth daily.    Marland Kitchen gabapentin (NEURONTIN) 300 MG capsule Take 1 capsule by mouth at bedtime.  2  . HYDROcodone-acetaminophen (NORCO) 7.5-325 MG per tablet Take 1 tablet by mouth 2 (two) times daily as needed for moderate pain.    Marland Kitchen LORazepam (ATIVAN) 1 MG tablet Take 1 mg by mouth at bedtime.    Marland Kitchen losartan (COZAAR) 25 MG tablet Take 1 tablet (25 mg total) by mouth daily. 30 tablet 6  . Multiple Vitamin (MULTIVITAMIN) tablet Take 1 tablet by mouth daily.    . nisoldipine (SULAR) 17 MG 24 hr tablet Take 17 mg by mouth daily.    Marland Kitchen  nitroGLYCERIN (NITROSTAT) 0.4 MG SL tablet Place 1 tablet (0.4 mg total) under the tongue every 5 (five) minutes as needed for chest pain. 25 tablet 3  . omeprazole (PRILOSEC) 20 MG capsule Take 20 mg by mouth daily.    Marland Kitchen terazosin (HYTRIN) 1 MG capsule Take 1 mg by mouth at bedtime.    . ticagrelor (BRILINTA) 90 MG TABS tablet Take 1 tablet (90 mg total) by mouth 2 (two) times daily. 180 tablet 3   No current facility-administered medications for this visit.      Past Surgical History:  Procedure Laterality Date  . APPENDECTOMY  1948  . BACK SURGERY    . CARDIAC CATHETERIZATION  05/1990  . CARDIAC CATHETERIZATION N/A 01/06/2016   Procedure: Left Heart Cath and Coronary Angiography;  Surgeon: Troy Sine, MD;  Location: [facility];  Service: Cardiovascular;  Laterality: N/A;  . CARDIAC CATHETERIZATION N/A 01/06/2016   Procedure: Coronary Stent Intervention;  Surgeon:  Troy Sine, MD;  Location: [facility];  Service: Cardiovascular;  Laterality: N/A;  RCA mid  . CATARACT EXTRACTION W/ INTRAOCULAR LENS  IMPLANT, BILATERAL Bilateral   . POSTERIOR LUMBAR FUSION  08/1982; 05/1996     Allergies  Allergen Reactions  . Prednisone Other (See Comments)    Tablet form only per patient (paranoid).  Injection form is fine.       Family History  Problem Relation Age of Onset  . Heart attack Father     MID 70'S  . Cancer Father   . Diabetes Mother   . Stroke Mother   . Diabetes Brother     X'S 2     Social History Mr. Michelsen reports that he quit smoking about 14 years ago. His smoking use included Cigars. He quit after 35.00 years of use. He has never used smokeless tobacco. Mr. Delman reports that he drinks alcohol.   Review of Systems CONSTITUTIONAL: No weight loss, fever, chills, weakness or fatigue.  HEENT: Eyes: No visual loss, blurred vision, double vision or yellow sclerae.No hearing loss, sneezing, congestion, runny nose or sore throat.  SKIN: No rash or itching.  CARDIOVASCULAR: per HPI RESPIRATORY: No shortness of breath, cough or sputum.  GASTROINTESTINAL: No anorexia, nausea, vomiting or diarrhea. No abdominal pain or blood.  GENITOURINARY: No burning on urination, no polyuria NEUROLOGICAL: No headache, dizziness, syncope, paralysis, ataxia, numbness or tingling in the extremities. No change in bowel or bladder control.  MUSCULOSKELETAL: No muscle, back pain, joint pain or stiffness.  LYMPHATICS: No enlarged nodes. No history of splenectomy.  PSYCHIATRIC: No history of depression or anxiety.  ENDOCRINOLOGIC: No reports of sweating, cold or heat intolerance. No polyuria or polydipsia.  Marland Kitchen   Physical Examination Vitals:   08/15/16 1351  BP: 118/73  Pulse: 67   Vitals:   08/15/16 1351  Weight: 215 lb (97.5 kg)  Height: 5\' 5"  (1.651 m)    Gen: resting comfortably, no acute distress HEENT: no scleral icterus, pupils  equal round and reactive, no palptable cervical adenopathy,  CV: RRR, no m/r/g, no jvd Resp: Clear to auscultation bilaterally GI: abdomen is soft, non-tender, non-distended, normal bowel sounds, no hepatosplenomegaly MSK: extremities are warm, no edema.  Skin: warm, no rash Neuro:  no focal deficits Psych: appropriate affect   Diagnostic Studies [facility] EKG 05/2013 NSR, LAE, inferior Q waves   [facility] Cath 05/30/1990 DIAGNOSTIC SUMMARY Coronary Artery Disease RCA system: normal Left Main: normal No significant CAD indicated LAD system: normal Left Ventriculogram  LCX system: normal Ejection Fraction: 71%  COMMENT The LAD curls the Apex. The RCA and Cirx arteries both contribute small PDA's.   07/2013 Echo LVEF 45-50%, mild LVH, grade I diastolic dysfunction, hypokinesis of the apicalanterior and apical latera walls.    12/2015 cath  Mid RCA-1 lesion, 30 %stenosed.  A STENT SYNERGY DES 2.75X20 drug eluting stent was successfully placed.  Mid RCA-2 lesion, 80 %stenosed.  Post intervention, there is a 0% residual stenosis.  The left ventricular systolic function is normal.  LV end diastolic pressure is normal.  The left ventricular ejection fraction is 55-65% by visual estimate.  There is no mitral valve regurgitation.  Normal LV function with an ejection fraction of approximately 55-60%. LVEDP 13 mm Hg.  Normal left coronary coronary circulation with a large LAD and circumflex vessel and diminutive ramus intermediate vessel.  Single vessel CAD with 30% followed by 80% mid RCA stenosis.  Successful PCI with PTCA/DES stenting of the mid RCA with ultimate insertion of a 2.7x 20 mm Synergy DES stent postdilated to 2.92 mm with the stenoses being reduced to 0%.  Postcath RECOMMENDATION: The patient will continue with dual antiplatelet and medical therapy. Aggressive lipid intervention with a target LDL less than 70 will be undertaken. The patient will  follow up with Dr. Carlyle Dolly.     Assessment and Plan  1. CAD - DES to RCA 01/06/16, continue DAPT at least until 12/2016 -recent chest pain symptoms - we will obtain exercise nuclear stress. Hold coreg day of test  2. HTN  -he is at goal, continue current meds  3. OSA  - he will continue CPAP  4. Hyperlipidemia - at goal, continue high dose staitn.   5. Morbid obesity - BMI is 36 - counsled on exercise and weigh tloss     Arnoldo Lenis, M.D.

## 2016-08-16 ENCOUNTER — Encounter: Payer: Self-pay | Admitting: *Deleted

## 2016-08-23 ENCOUNTER — Encounter (HOSPITAL_COMMUNITY)
Admission: RE | Admit: 2016-08-23 | Discharge: 2016-08-23 | Disposition: A | Discharge status: Home / Self Care | Payer: Medicare Other | Source: Ambulatory Visit | Attending: Cardiology | Admitting: Cardiology

## 2016-08-23 ENCOUNTER — Other Ambulatory Visit: Payer: Self-pay | Admitting: Cardiology

## 2016-08-23 ENCOUNTER — Encounter (HOSPITAL_COMMUNITY): Payer: Self-pay

## 2016-08-23 ENCOUNTER — Inpatient Hospital Stay (HOSPITAL_COMMUNITY): Admission: RE | Admit: 2016-08-23 | Payer: Medicare Other | Source: Ambulatory Visit

## 2016-08-23 DIAGNOSIS — R079 Chest pain, unspecified: Secondary | ICD-10-CM | POA: Diagnosis present

## 2016-08-23 LAB — NM MYOCAR MULTI W/SPECT W/WALL MOTION / EF
CHL CUP NUCLEAR SRS: 1
CHL RATE OF PERCEIVED EXERTION: 17
CSEPED: 6 min
CSEPEW: 7.2 METS
CSEPPHR: 133 {beats}/min
Exercise duration (sec): 26 s
LHR: 0.32
LV dias vol: 87 mL (ref 62–150)
LVSYSVOL: 29 mL
MPHR: 150 {beats}/min
NUC STRESS TID: 1.07
Percent HR: 88 %
Rest HR: 77 {beats}/min
SDS: 0
SSS: 1

## 2016-08-23 MED ORDER — TECHNETIUM TC 99M TETROFOSMIN IV KIT
10.0000 | PACK | Freq: Once | INTRAVENOUS | Status: AC | PRN
  Administered 2016-08-23: 10 via INTRAVENOUS

## 2016-08-23 MED ORDER — TECHNETIUM TC 99M TETROFOSMIN IV KIT
30.0000 | PACK | Freq: Once | INTRAVENOUS | Status: AC | PRN
  Administered 2016-08-23: 30 via INTRAVENOUS

## 2016-08-23 MED ORDER — REGADENOSON 0.4 MG/5ML IV SOLN
INTRAVENOUS | Status: AC
  Filled 2016-08-23: qty 5

## 2016-08-23 MED ORDER — SODIUM CHLORIDE 0.9% FLUSH
INTRAVENOUS | Status: AC
  Administered 2016-08-23: 10 mL via INTRAVENOUS
  Filled 2016-08-23: qty 10

## 2016-08-24 ENCOUNTER — Other Ambulatory Visit: Payer: Self-pay | Admitting: Cardiology

## 2016-08-29 ENCOUNTER — Telehealth: Payer: Self-pay | Admitting: *Deleted

## 2016-08-29 NOTE — Telephone Encounter (Signed)
-----   Message from Arnoldo Lenis, MD sent at 08/25/2016  1:49 PM EDT ----- Stress test looks good, no evidence of blockages/ F/u 3-4 weeks to discuss in detail and reevaluate symptoms  J BrancH MD

## 2016-08-29 NOTE — Telephone Encounter (Signed)
Pt aware - f/u appt scheduled - routed to pcp

## 2016-09-28 ENCOUNTER — Encounter: Payer: Self-pay | Admitting: Cardiology

## 2016-09-28 ENCOUNTER — Ambulatory Visit (INDEPENDENT_AMBULATORY_CARE_PROVIDER_SITE_OTHER): Payer: Medicare Other | Admitting: Cardiology

## 2016-09-28 VITALS — BP 132/71 | HR 67 | Ht 65.0 in | Wt 215.2 lb

## 2016-09-28 DIAGNOSIS — E782 Mixed hyperlipidemia: Secondary | ICD-10-CM | POA: Diagnosis not present

## 2016-09-28 DIAGNOSIS — I2510 Atherosclerotic heart disease of native coronary artery without angina pectoris: Secondary | ICD-10-CM

## 2016-09-28 DIAGNOSIS — I10 Essential (primary) hypertension: Secondary | ICD-10-CM | POA: Diagnosis not present

## 2016-09-28 NOTE — Progress Notes (Signed)
Clinical Summary Edwin Monroe is a 71 y.o.male seen today for follow up of the following medical problems.   1. CAD - notes indicate remote history of MI in early 84s in [place] at [place]. Transferred to [facility] at that time,cath at that time showed patent coronaries -  Received DES to RCA 12/2015. LVEF by LVgram at that time 55-65% - 07/2016 exercise nuclear stress: no ischemia   - no recurrent chest pain. No recent SOB/DOE - compliant with meds  2. HTN  - he remains compliant with meds   3. OSA  - [facility]   4. Hyperlipidemia - [facility]  - 01/2016 LDL 18 HDL 33 TG 90 TC 69  5. DM2 - followed by pcp   Past Medical History:  Diagnosis Date  . Agent orange exposure [id number]  . Anxiety disorder   . Arthritis    "back, knees, shoulders" (01/06/2016)  . Asthma   . CAD (coronary artery disease)    a. remote MI 72 with OK cath at that time. b. LHC 01/06/16:  PTCA/DES to mRCA, left coronary system OK, LVEF 55-65%.   . Cardiomyopathy ([place])    a. EF 45-50% in 2015. b. LVEF 55-65% in 2017.  Marland Kitchen Chronic lower back pain   . GERD (gastroesophageal reflux disease)   . Hypertension   . Hypothyroidism    "got nuclear treatment in the 70's"  . Myocardial infarction 04/1990    in [place] at [place]/notes 08/21/2013  . OSA on CPAP   . Prediabetes      Allergies  Allergen Reactions  . Prednisone Other (See Comments)    Tablet form only per patient (paranoid).  Injection form is fine.      Current Outpatient Prescriptions  Medication Sig Dispense Refill  . Albuterol Sulfate (VENTOLIN HFA IN) Inhale 1 puff into the lungs every 6 (six) hours as needed (wheezing).     Marland Kitchen aspirin EC 81 MG tablet Take 81 mg by mouth daily.    Marland Kitchen atorvastatin (LIPITOR) 80 MG tablet TAKE 1 TABLET BY MOUTH EVERY EVENING 30 tablet 6  . Calcium Carbonate-Vitamin D (CALTRATE 600+D PO) Take 1 tablet by mouth daily.     .  carvedilol (COREG) 25 MG tablet Take 1 tablet (25 mg total) by mouth 2 (two) times daily. 180 tablet 3  . Cholecalciferol (VITAMIN D3) 2000 units TABS Take 1 tablet by mouth daily.    . cyclobenzaprine (FLEXERIL) 10 MG tablet Take 10 mg by mouth at bedtime.    . furosemide (LASIX) 20 MG tablet Take 20 mg by mouth daily.    Marland Kitchen gabapentin (NEURONTIN) 300 MG capsule Take 1 capsule by mouth at bedtime.  2  . glipiZIDE (GLUCOTROL) 5 MG tablet Take by mouth daily before breakfast.    . HYDROcodone-acetaminophen (NORCO) 7.5-325 MG per tablet Take 1 tablet by mouth 2 (two) times daily as needed for moderate pain.    Marland Kitchen LORazepam (ATIVAN) 1 MG tablet Take 1 mg by mouth at bedtime.    Marland Kitchen losartan (COZAAR) 25 MG tablet Take 25 mg by mouth daily.    Marland Kitchen losartan (COZAAR) 25 MG tablet Take 1 tablet (25 mg total) by mouth daily. 30 tablet 2  . Multiple Vitamin (MULTIVITAMIN) tablet Take 1 tablet by mouth daily.    . nisoldipine (SULAR) 17 MG 24 hr tablet Take 17 mg by mouth daily.    . nitroGLYCERIN (NITROSTAT) 0.4 MG SL  tablet Place 1 tablet (0.4 mg total) under the tongue every 5 (five) minutes as needed for chest pain. 25 tablet 3  . omeprazole (PRILOSEC) 20 MG capsule Take 20 mg by mouth daily.    Marland Kitchen terazosin (HYTRIN) 1 MG capsule Take 1 mg by mouth at bedtime.    . ticagrelor (BRILINTA) 90 MG TABS tablet Take 1 tablet (90 mg total) by mouth 2 (two) times daily. 180 tablet 3   No current facility-administered medications for this visit.      Past Surgical History:  Procedure Laterality Date  . APPENDECTOMY  1948  . BACK SURGERY    . CARDIAC CATHETERIZATION  05/1990  . CARDIAC CATHETERIZATION N/A 01/06/2016   Procedure: Left Heart Cath and Coronary Angiography;  Surgeon: Troy Sine, MD;  Location: [facility];  Service: Cardiovascular;  Laterality: N/A;  . CARDIAC CATHETERIZATION N/A 01/06/2016   Procedure: Coronary Stent Intervention;  Surgeon: Troy Sine, MD;  Location: [facility];  Service: Cardiovascular;  Laterality: N/A;  RCA mid  . CATARACT EXTRACTION W/ INTRAOCULAR LENS  IMPLANT, BILATERAL Bilateral   . POSTERIOR LUMBAR FUSION  08/1982; 05/1996     Allergies  Allergen Reactions  . Prednisone Other (See Comments)    Tablet form only per patient (paranoid).  Injection form is fine.       Family History  Problem Relation Age of Onset  . Heart attack Father     MID 70'S  . Cancer Father   . Diabetes Mother   . Stroke Mother   . Diabetes Brother     X'S 2     Social History Edwin Monroe reports that he quit smoking about 14 years ago. His smoking use included Cigars. He quit after 35.00 years of use. He has never used smokeless tobacco. Edwin Monroe reports that he drinks alcohol.   Review of Systems CONSTITUTIONAL: No weight loss, fever, chills, weakness or fatigue.  HEENT: Eyes: No visual loss, blurred vision, double vision or yellow sclerae.No hearing loss, sneezing, congestion, runny nose or sore throat.  SKIN: No rash or itching.  CARDIOVASCULAR: per hpi RESPIRATORY: No shortness of breath, cough or sputum.  GASTROINTESTINAL: No anorexia, nausea, vomiting or diarrhea. No abdominal pain or blood.  GENITOURINARY: No burning on urination, no polyuria NEUROLOGICAL: No headache, dizziness, syncope, paralysis, ataxia, numbness or tingling in the extremities. No change in bowel or bladder control.  MUSCULOSKELETAL: No muscle, back pain, joint pain or stiffness.  LYMPHATICS: No enlarged nodes. No history of splenectomy.  PSYCHIATRIC: No history of depression or anxiety.  ENDOCRINOLOGIC: No reports of sweating, cold or heat intolerance. No polyuria or polydipsia.  Marland Kitchen   Physical Examination Vitals:   09/28/16 1312  BP: 132/71  Pulse: 67   Vitals:   09/28/16 1312  Weight: 215 lb 3.2 oz (97.6 kg)  Height: 5\' 5"  (1.651 m)    Gen: resting comfortably, no acute distress HEENT: no scleral icterus, pupils equal round and reactive, no palptable  cervical adenopathy,  CV: RRR, no m/rg, no jvd Resp: Clear to auscultation bilaterally GI: abdomen is soft, non-tender, non-distended, normal bowel sounds, no hepatosplenomegaly MSK: extremities are warm, no edema.  Skin: warm, no rash Neuro:  no focal deficits Psych: appropriate affect   Diagnostic Studies [facility] EKG 05/2013 NSR, LAE, inferior Q waves   [facility] Cath 05/30/1990 DIAGNOSTIC SUMMARY Coronary Artery Disease RCA system: normal Left Main: normal No significant CAD indicated LAD system: normal Left Ventriculogram LCX system: normal  Ejection Fraction: 71%  COMMENT The LAD curls the Apex. The RCA and Cirx arteries both contribute small PDA's.   07/2013 Echo LVEF 45-50%, mild LVH, grade I diastolic dysfunction, hypokinesis of the apicalanterior and apical latera walls.    12/2015 cath  Mid RCA-1 lesion, 30 %stenosed.  A STENT SYNERGY DES 2.75X20 drug eluting stent was successfully placed.  Mid RCA-2 lesion, 80 %stenosed.  Post intervention, there is a 0% residual stenosis.  The left ventricular systolic function is normal.  LV end diastolic pressure is normal.  The left ventricular ejection fraction is 55-65% by visual estimate.  There is no mitral valve regurgitation.  Normal LV function with an ejection fraction of approximately 55-60%. LVEDP 13 mm Hg.  Normal left coronary coronary circulation with a large LAD and circumflex vessel and diminutive ramus intermediate vessel.  Single vessel CAD with 30% followed by 80% mid RCA stenosis.  Successful PCI with PTCA/DES stenting of the mid RCA with ultimate insertion of a 2.7x 20 mm Synergy DES stent postdilated to 2.92 mm with the stenoses being reduced to 0%.  Postcath RECOMMENDATION: The patient will continue with dual antiplatelet and medical therapy. Aggressive lipid intervention with a target LDL less than 70 will be undertaken. The patient will follow up with Dr. Carlyle Dolly.   07/2016 Exercise nuclear stress  Blood pressure demonstrated a hypertensive response to exercise.  There was no ST segment deviation noted during stress.  The study is normal. There are no perfusion defects consistent with prior infarct or current ishcemia.  This is a low risk study.  The left ventricular ejection fraction is hyperdynamic (>65%).   Assessment and Plan   1. CAD - DES to RCA 01/06/16, continue DAPT at least until 12/2016 -no recenty symptoms, conitnue currnet meds  2. HTN  -he is at goal, he will continue current meds  3. Hyperlipidemia - lipids have been at goal, continue high dose staitn.        Arnoldo Lenis, M.D.

## 2016-09-28 NOTE — Patient Instructions (Signed)
Your physician wants you to follow-up in: 4 MONTHS WITH DR BRANCH You will receive a reminder letter in the mail two months in advance. If you don't receive a letter, please call our office to schedule the follow-up appointment.  Your physician recommends that you continue on your current medications as directed. Please refer to the Current Medication list given to you today.  Thank you for choosing [facility]!!   

## 2016-11-21 ENCOUNTER — Other Ambulatory Visit: Payer: Self-pay | Admitting: Cardiology

## 2016-12-14 ENCOUNTER — Other Ambulatory Visit: Payer: Self-pay | Admitting: Physician Assistant

## 2017-02-06 ENCOUNTER — Ambulatory Visit (INDEPENDENT_AMBULATORY_CARE_PROVIDER_SITE_OTHER): Payer: Medicare Other | Admitting: Cardiology

## 2017-02-06 ENCOUNTER — Encounter: Payer: Self-pay | Admitting: Cardiology

## 2017-02-06 VITALS — BP 114/86 | HR 66 | Ht 65.0 in | Wt 220.4 lb

## 2017-02-06 DIAGNOSIS — I10 Essential (primary) hypertension: Secondary | ICD-10-CM | POA: Diagnosis not present

## 2017-02-06 DIAGNOSIS — I2510 Atherosclerotic heart disease of native coronary artery without angina pectoris: Secondary | ICD-10-CM

## 2017-02-06 DIAGNOSIS — R0602 Shortness of breath: Secondary | ICD-10-CM

## 2017-02-06 DIAGNOSIS — E782 Mixed hyperlipidemia: Secondary | ICD-10-CM | POA: Diagnosis not present

## 2017-02-06 DIAGNOSIS — I5033 Acute on chronic diastolic (congestive) heart failure: Secondary | ICD-10-CM

## 2017-02-06 MED ORDER — FUROSEMIDE 20 MG PO TABS: ORAL_TABLET | ORAL | 1 refills | Status: DC

## 2017-02-06 NOTE — Patient Instructions (Signed)
Your physician wants you to follow-up in: [place] will receive a reminder letter in the mail two months in advance. If you don't receive a letter, please call our office to schedule the follow-up appointment.  Your physician has recommended you make the following change in your medication:   CHANGE LASIX 40 MG (2 TABLETS) FOR 3 DAYS THEN RESUME 20 MG DAILY - MAY TAKE ADDITIONAL 20 MG AS NEEDED  Your physician has recommended that you have a pulmonary function test. Pulmonary Function Tests are a group of tests that measure how well air moves in and out of your lungs.  Thank you for choosing [facility]!!

## 2017-02-06 NOTE — Progress Notes (Signed)
Clinical Summary Edwin Monroe is a 71 y.o.male  seen today for follow up of the following medical problems.   1. CAD - notes indicate remote history of MI in early 60s in [place] at [place]. Transferred to [facility] at that time,cath at that time showed patent coronaries - Received DES to RCA 12/2015. LVEF by LVgram at that time 55-65% - 07/2016 exercise nuclear stress: no ischemia   - denies any chest pan. Has had some SOB recently over the last month. Has some occasional LE edema. Some cough and wheezing. Home weights up 6 lbs in 3 weeks.   2. HTN  - takes his meds daily  3. OSA  - [facility]   [facility]. [facility]  - 07/2016 TC 91 TG 117 HDL 38 LDL 30 - he is on a statin  5. DM2 - followed by pcp  6. SOB - recent weight gain, edema - also reports intermittent SOB and wheezing, does have prior smoking history, never had PFTs Past Medical History:  Diagnosis Date  . Agent orange exposure [id number]  . Anxiety disorder   . Arthritis    "back, knees, shoulders" (01/06/2016)  . Asthma   . CAD (coronary artery disease)    a. remote MI 18 with OK cath at that time. b. LHC 01/06/16:  PTCA/DES to mRCA, left coronary system OK, LVEF 55-65%.   . Cardiomyopathy ([place])    a. EF 45-50% in 2015. b. LVEF 55-65% in 2017.  Marland Kitchen Chronic lower back pain   . GERD (gastroesophageal reflux disease)   . Hypertension   . Hypothyroidism    "got nuclear treatment in the 70's"  . Myocardial infarction ([place]) 04/1990    in [place] at [place]/notes 08/21/2013  . OSA on CPAP   . Prediabetes      Allergies  Allergen Reactions  . Prednisone Other (See Comments)    Tablet form only per patient (paranoid).  Injection form is fine.      Current Outpatient Prescriptions  Medication Sig Dispense Refill  . Albuterol Sulfate (VENTOLIN HFA IN) Inhale 1 puff into the lungs every 6 (six) hours as needed (wheezing).     Marland Kitchen aspirin EC 81  MG tablet Take 81 mg by mouth daily.    Marland Kitchen atorvastatin (LIPITOR) 80 MG tablet TAKE 1 TABLET BY MOUTH EVERY EVENING 30 tablet 6  . Calcium Carbonate-Vitamin D (CALTRATE 600+D PO) Take 1 tablet by mouth daily.     . carvedilol (COREG) 25 MG tablet Take 1 tablet (25 mg total) by mouth 2 (two) times daily. 180 tablet 3  . Cholecalciferol (VITAMIN D3) 2000 units TABS Take 1 tablet by mouth daily.    . cyclobenzaprine (FLEXERIL) 10 MG tablet Take 10 mg by mouth at bedtime.    . furosemide (LASIX) 20 MG tablet Take 20 mg by mouth daily.    Marland Kitchen gabapentin (NEURONTIN) 300 MG capsule Take 1 capsule by mouth at bedtime.  2  . glipiZIDE (GLUCOTROL) 5 MG tablet Take by mouth daily before breakfast.    . HYDROcodone-acetaminophen (NORCO) 7.5-325 MG per tablet Take 1 tablet by mouth 2 (two) times daily as needed for moderate pain.    Marland Kitchen LORazepam (ATIVAN) 1 MG tablet Take 1 mg by mouth at bedtime.    Marland Kitchen losartan (COZAAR) 25 MG tablet TAKE 1 TABLET (25 MG TOTAL) BY MOUTH DAILY. 90 tablet 0  . Multiple Vitamin (MULTIVITAMIN) tablet Take 1 tablet by mouth  daily.    . nisoldipine (SULAR) 17 MG 24 hr tablet Take 17 mg by mouth daily.    . nitroGLYCERIN (NITROSTAT) 0.4 MG SL tablet Place 1 tablet (0.4 mg total) under the tongue every 5 (five) minutes as needed for chest pain. 25 tablet 3  . omeprazole (PRILOSEC) 20 MG capsule Take 20 mg by mouth daily.    Marland Kitchen terazosin (HYTRIN) 1 MG capsule Take 1 mg by mouth at bedtime.    . ticagrelor (BRILINTA) 90 MG TABS tablet Take 1 tablet (90 mg total) by mouth 2 (two) times daily. 180 tablet 0   No current facility-administered medications for this visit.      Past Surgical History:  Procedure Laterality Date  . APPENDECTOMY  1948  . BACK SURGERY    . CARDIAC CATHETERIZATION  05/1990  . CARDIAC CATHETERIZATION N/A 01/06/2016   Procedure: Left Heart Cath and Coronary Angiography;  Surgeon: Troy Sine, MD;  Location: [facility];  Service: Cardiovascular;   Laterality: N/A;  . CARDIAC CATHETERIZATION N/A 01/06/2016   Procedure: Coronary Stent Intervention;  Surgeon: Troy Sine, MD;  Location: [facility];  Service: Cardiovascular;  Laterality: N/A;  RCA mid  . CATARACT EXTRACTION W/ INTRAOCULAR LENS  IMPLANT, BILATERAL Bilateral   . POSTERIOR LUMBAR FUSION  08/1982; 05/1996     Allergies  Allergen Reactions  . Prednisone Other (See Comments)    Tablet form only per patient (paranoid).  Injection form is fine.       Family History  Problem Relation Age of Onset  . Heart attack Father        MID 70'S  . Cancer Father   . Diabetes Mother   . Stroke Mother   . Diabetes Brother        X'S 2     Social History Edwin Monroe reports that he quit smoking about 14 years ago. His smoking use included Cigars. He quit after 35.00 years of use. He has never used smokeless tobacco. Edwin Monroe reports that he drinks alcohol.   Review of Systems CONSTITUTIONAL: No weight loss, fever, chills, weakness or fatigue.  HEENT: Eyes: No visual loss, blurred vision, double vision or yellow sclerae.No hearing loss, sneezing, congestion, runny nose or sore throat.  SKIN: No rash or itching.  CARDIOVASCULAR: per hpi RESPIRATORY: per hpi GASTROINTESTINAL: No anorexia, nausea, vomiting or diarrhea. No abdominal pain or blood.  GENITOURINARY: No burning on urination, no polyuria NEUROLOGICAL: No headache, dizziness, syncope, paralysis, ataxia, numbness or tingling in the extremities. No change in bowel or bladder control.  MUSCULOSKELETAL: No muscle, back pain, joint pain or stiffness.  LYMPHATICS: No enlarged nodes. No history of splenectomy.  PSYCHIATRIC: No history of depression or anxiety.  ENDOCRINOLOGIC: No reports of sweating, cold or heat intolerance. No polyuria or polydipsia.  Marland Kitchen   Physical Examination Vitals:   02/06/17 1412  BP: 114/86  Pulse: 66  SpO2: 95%   Vitals:   02/06/17 1412  Weight: 220 lb 6.4 oz (100 kg)    Height: 5\' 5"  (1.651 m)    Gen: resting comfortably, no acute distress HEENT: no scleral icterus, pupils equal round and reactive, no palptable cervical adenopathy,  CV: RRR, no mr/g, no jvd Resp: Clear to auscultation bilaterally GI: abdomen is soft, non-tender, non-distended, normal bowel sounds, no hepatosplenomegaly MSK: extremities are warm, 1+ bialteral edema Skin: warm, no rash Neuro:  no focal deficits Psych: appropriate affect   Diagnostic Studies [facility] Cath 05/30/1990 DIAGNOSTIC SUMMARY  Coronary Artery Disease RCA system: normal Left Main: normal No significant CAD indicated LAD system: normal Left Ventriculogram LCX system: normal Ejection Fraction: 71%  COMMENT The LAD curls the Apex. The RCA and Cirx arteries both contribute small PDA's.   07/2013 Echo LVEF 45-50%, mild LVH, grade I diastolic dysfunction, hypokinesis of the apicalanterior and apical latera walls.    12/2015 cath  Mid RCA-1 lesion, 30 %stenosed.  A STENT SYNERGY DES 2.75X20 drug eluting stent was successfully placed.  Mid RCA-2 lesion, 80 %stenosed.  Post intervention, there is a 0% residual stenosis.  The left ventricular systolic function is normal.  LV end diastolic pressure is normal.  The left ventricular ejection fraction is 55-65% by visual estimate.  There is no mitral valve regurgitation.  Normal LV function with an ejection fraction of approximately 55-60%. LVEDP 13 mm Hg.  Normal left coronary coronary circulation with a large LAD and circumflex vessel and diminutive ramus intermediate vessel.  Single vessel CAD with 30% followed by 80% mid RCA stenosis.  Successful PCI with PTCA/DES stenting of the mid RCA with ultimate insertion of a 2.7x 20 mm Synergy DES stent postdilated to 2.92 mm with the stenoses being reduced to 0%.  Postcath RECOMMENDATION: The patient will continue with dual antiplatelet and medical therapy. Aggressive lipid intervention with a  target LDL less than 70 will be undertaken. The patient will follow up with Dr. Carlyle Dolly.   07/2016 Exercise nuclear stress  Blood pressure demonstrated a hypertensive response to exercise.  There was no ST segment deviation noted during stress.  The study is normal. There are no perfusion defects consistent with prior infarct or current ishcemia.  This is a low risk study.  The left ventricular ejection fraction is hyperdynamic (>65%).     Assessment and Plan  1. CAD - DES to RCA 01/06/16, continue DAPT at least until 12/2016 -no recent chest pain, we will continue current meds. EKG in clinic shows SR, no ischemic changes  2. Acute on chronic diastolic HF - recent weight gain and edema, he will increase lasix to 40mg  x 4 days then resume 20mg  daily  2. HTN  -his bp is at goal, continue current meds  3. Hyperlipidemia - at goal, continue statin  4. SOB/Wheezing - reports intermittent symptoms, we will obtain PFTs      Arnoldo Lenis, M.D.

## 2017-02-07 ENCOUNTER — Telehealth: Payer: Self-pay | Admitting: Cardiology

## 2017-02-07 NOTE — Telephone Encounter (Signed)
Pre-cert Verification for the following procedure   PFT'S scheduled for 02-16-17

## 2017-02-16 ENCOUNTER — Ambulatory Visit (HOSPITAL_COMMUNITY)
Admission: RE | Admit: 2017-02-16 | Discharge: 2017-02-16 | Disposition: A | Discharge status: Home / Self Care | Payer: Medicare Other | Source: Ambulatory Visit | Attending: Cardiology | Admitting: Cardiology

## 2017-02-16 DIAGNOSIS — J449 Chronic obstructive pulmonary disease, unspecified: Secondary | ICD-10-CM | POA: Diagnosis not present

## 2017-02-16 DIAGNOSIS — R0602 Shortness of breath: Secondary | ICD-10-CM | POA: Diagnosis present

## 2017-02-16 MED ORDER — ALBUTEROL SULFATE (2.5 MG/3ML) 0.083% IN NEBU
2.5000 mg | INHALATION_SOLUTION | Freq: Once | RESPIRATORY_TRACT | Status: AC
  Administered 2017-02-16: 2.5 mg via RESPIRATORY_TRACT

## 2017-02-21 ENCOUNTER — Other Ambulatory Visit: Payer: Self-pay | Admitting: Cardiology

## 2017-02-26 ENCOUNTER — Telehealth: Payer: Self-pay | Admitting: Cardiology

## 2017-02-26 NOTE — Telephone Encounter (Signed)
Edwin Monroe called wanting to know if we have recent test results of a breathing test. Please call [phone].

## 2017-02-26 NOTE — Telephone Encounter (Signed)
Pt aware that we are working to get PFT results

## 2017-03-05 LAB — PULMONARY FUNCTION TEST
DL/VA % pred: 97 %
DL/VA: 4.23 ml/min/mmHg/L
DLCO UNC: 17.85 ml/min/mmHg
DLCO cor % pred: 68 %
DLCO cor: 18.51 ml/min/mmHg
DLCO unc % pred: 66 %
FEF 25-75 POST: 2.63 L/s
FEF 25-75 Pre: 2.05 L/sec
FEF2575-%Change-Post: 27 %
FEF2575-%Pred-Post: 126 %
FEF2575-%Pred-Pre: 98 %
FEV1-%CHANGE-POST: 7 %
FEV1-%PRED-POST: 91 %
FEV1-%PRED-PRE: 84 %
FEV1-POST: 2.21 L
FEV1-PRE: 2.05 L
FEV1FVC-%Change-Post: -3 %
FEV1FVC-%PRED-PRE: 108 %
FEV6-%Change-Post: 11 %
FEV6-%PRED-POST: 90 %
FEV6-%Pred-Pre: 81 %
FEV6-POST: 2.76 L
FEV6-Pre: 2.48 L
FEV6FVC-%CHANGE-POST: 0 %
FEV6FVC-%PRED-POST: 105 %
FEV6FVC-%PRED-PRE: 106 %
FVC-%CHANGE-POST: 11 %
FVC-%Pred-Post: 85 %
FVC-%Pred-Pre: 76 %
FVC-Post: 2.77 L
FVC-Pre: 2.48 L
POST FEV6/FVC RATIO: 99 %
PRE FEV6/FVC RATIO: 100 %
Post FEV1/FVC ratio: 80 %
Pre FEV1/FVC ratio: 83 %
RV % PRED: 61 %
RV: 1.39 L
TLC % PRED: 60 %
TLC: 3.77 L

## 2017-03-15 ENCOUNTER — Telehealth: Payer: Self-pay | Admitting: Cardiology

## 2017-03-15 ENCOUNTER — Other Ambulatory Visit: Payer: Self-pay | Admitting: Cardiology

## 2017-03-15 DIAGNOSIS — R942 Abnormal results of pulmonary function studies: Secondary | ICD-10-CM

## 2017-03-15 NOTE — Telephone Encounter (Signed)
Edwin Monroe called requesting his test results of recent PFT's.  Please call [phone]

## 2017-03-15 NOTE — Telephone Encounter (Signed)
Fwd to Dr. Harl Bowie nurse, she is aware & working on this.

## 2017-03-16 NOTE — Telephone Encounter (Signed)
PFT results completed - routed to Dr Harl Bowie

## 2017-03-16 NOTE — Telephone Encounter (Signed)
Please apologize, the results were never routed to me. His PFTs do show some mild abnormalities, please refer to Dr Luan Pulling to go through the results in detail and consider any further testing/treatment   Zandra Abts MD

## 2017-03-16 NOTE — Telephone Encounter (Signed)
Pt aware of technical problems with PFT results and appreciative of explanation - agreeable to referral Dr Luan Pulling. Orders placed and routed to schedulers. Routed PFT results to pcp

## 2017-05-04 ENCOUNTER — Ambulatory Visit: Payer: Medicare Other | Admitting: Adult Health

## 2017-05-08 ENCOUNTER — Ambulatory Visit: Payer: Medicare Other | Admitting: Adult Health

## 2017-05-09 ENCOUNTER — Encounter: Payer: Self-pay | Admitting: Physician Assistant

## 2017-05-09 ENCOUNTER — Ambulatory Visit (INDEPENDENT_AMBULATORY_CARE_PROVIDER_SITE_OTHER): Payer: Medicare Other | Admitting: Physician Assistant

## 2017-05-09 VITALS — BP 162/90 | HR 87 | Ht 67.0 in | Wt 212.0 lb

## 2017-05-09 DIAGNOSIS — K922 Gastrointestinal hemorrhage, unspecified: Secondary | ICD-10-CM

## 2017-05-09 DIAGNOSIS — I10 Essential (primary) hypertension: Secondary | ICD-10-CM | POA: Diagnosis not present

## 2017-05-09 DIAGNOSIS — I2510 Atherosclerotic heart disease of native coronary artery without angina pectoris: Secondary | ICD-10-CM

## 2017-05-09 DIAGNOSIS — I5032 Chronic diastolic (congestive) heart failure: Secondary | ICD-10-CM

## 2017-05-09 NOTE — Progress Notes (Signed)
Cardiology Office Note    Date:  05/09/2017   ID:  Edwin Monroe, DOB 1945-10-26, MRN [id number]  PCP:  Edwin Art, MD  Cardiologist: Dr. Harl Monroe  No chief complaint on file.   History of Present Illness:  Edwin Monroe is a 71 y.o. male with history of CAD status post remote MI in 54s at the [place] in [place] transferred to [facility] and had patent coronaries, status post DES to the RCA 12/2015 LVEF 55-65%.  No ischemia on exercise nuclear stress test 07/2016.  Also has hypertension, OSA with poor compliance on CPAP, hyperlipidemia.  Last saw Dr. Harl Monroe 01/2017 and had acute on chronic diastolic CHF and Lasix increased to 40 mg daily for 4 days then back to 20 mg.  Weight was up to 6 pounds to 220.  Patient comes in today for post hospital follow-up from [facility].  He was admitted with presyncope secondary to acute blood loss anemia secondary to lower GI bleed secondary to ulcerated mass that turned out to be benign according to the patient involving the proximal ascending colon.  He underwent hemicolectomy.  Hemoglobin 8.6 at discharge.  Aspirin and Brilinta were restarted and he was placed on  metoprolol 25 mg twice daily.   Patient comes in today for follow-up.  Overall he is feeling better.  He is fatigued.  When reviewing his medications he was already taking carvedilol and he is on metoprolol in addition to this.  He denies any chest pain, CHF symptoms.  He has a cold and has having trouble breathing.  He saw Dr. Luan Monroe yesterday for follow-up of his PFTs and will be seeing him back.    Past Medical History:  Diagnosis Date  . Agent orange exposure [id number]  . Anxiety disorder   . Arthritis    "back, knees, shoulders" (01/06/2016)  . Asthma   . CAD (coronary artery disease)    a. remote MI 89 with OK cath at that time. b. LHC 01/06/16:  PTCA/DES to mRCA, left coronary system OK, LVEF 55-65%.   . Cancer, colon ([place])   . Cardiomyopathy ([place])    a. EF  45-50% in 2015. b. LVEF 55-65% in 2017.  Marland Kitchen Chronic lower back pain   . GERD (gastroesophageal reflux disease)   . Hypertension   . Hypothyroidism    "got nuclear treatment in the 70's"  . Myocardial infarction ([place]) 04/1990    in [place] at [place]/notes 08/21/2013  . OSA on CPAP   . Prediabetes     Past Surgical History:  Procedure Laterality Date  . APPENDECTOMY  1948  . BACK SURGERY    . CARDIAC CATHETERIZATION  05/1990  . CARDIAC CATHETERIZATION N/A 01/06/2016   Procedure: Left Heart Cath and Coronary Angiography;  Surgeon: Troy Sine, MD;  Location: [place];  Service: Cardiovascular;  Laterality: N/A;  . CARDIAC CATHETERIZATION N/A 01/06/2016   Procedure: Coronary Stent Intervention;  Surgeon: Troy Sine, MD;  Location: [facility];  Service: Cardiovascular;  Laterality: N/A;  RCA mid  . CATARACT EXTRACTION W/ INTRAOCULAR LENS  IMPLANT, BILATERAL Bilateral   . POSTERIOR LUMBAR FUSION  08/1982; 05/1996    Current Medications: Current Meds  Medication Sig  . metoprolol tartrate (LOPRESSOR) 25 MG tablet Take 25 mg by mouth 2 (two) times daily.     Allergies:   Prednisone   Social History   Socioeconomic History  . Marital status: Married    Spouse name: None  . Number  of children: None  . Years of education: None  . Highest education level: None  Social Needs  . Financial resource strain: None  . Food insecurity - worry: None  . Food insecurity - inability: None  . Transportation needs - medical: None  . Transportation needs - non-medical: None  Occupational History  . None  Tobacco Use  . Smoking status: Former Smoker    Years: 35.00    Types: Cigars    Last attempt to quit: 05/29/2002    Years since quitting: 14.9  . Smokeless tobacco: Never Used  . Tobacco comment: NEVER SMOKED CIGARETTES  Substance and Sexual Activity  . Alcohol use: Yes    Alcohol/week: 0.0 oz    Comment: 01/06/2016 "stopped drinking in 2004"  . Drug use: No  .  Sexual activity: Not Currently  Other Topics Concern  . None  Social History Narrative  . None     Family History:  The patient's family history includes Cancer in his father; Diabetes in his brother and mother; Heart attack in his father; Stroke in his mother.   ROS:   Please see the history of present illness.    Review of Systems  Constitution: Positive for weakness and malaise/fatigue.  HENT: Negative.   Cardiovascular: Negative.   Respiratory: Negative.   Endocrine: Negative.   Hematologic/Lymphatic: Negative.   Musculoskeletal: Negative.   Gastrointestinal: Negative.   Genitourinary: Negative.    All other systems reviewed and are negative.   PHYSICAL EXAM:   VS:  BP (!) 162/90   Pulse 87   Ht 5\' 7"  (1.702 m)   Wt 212 lb (96.2 kg)   SpO2 96%   BMI 33.20 kg/m   Physical Exam  GEN: Well nourished, well developed, in no acute distress  Neck: no JVD, carotid bruits, or masses Cardiac:RRR; no murmurs, rubs, or gallops  Respiratory:  clear to auscultation bilaterally, normal work of breathing GI: soft, nontender, nondistended, + BS Ext: without cyanosis, clubbing, or edema, Good distal pulses bilaterally Neuro:  Alert and Oriented x 3,  Psych: euthymic mood, full affect  Wt Readings from Last 3 Encounters:  05/09/17 212 lb (96.2 kg)  02/06/17 220 lb 6.4 oz (100 kg)  09/28/16 215 lb 3.2 oz (97.6 kg)      Studies/Labs Reviewed:   EKG:  EKG is not ordered today.    Recent Labs: No results found for requested labs within last 8760 hours.   Lipid Panel No results found for: CHOL, TRIG, HDL, CHOLHDL, VLDL, LDLCALC, LDLDIRECT  Additional studies/ records that were reviewed today include:    [facility] Cath 05/30/1990   DIAGNOSTIC SUMMARY Coronary Artery Disease RCA system: normal Left Main: normal No significant CAD indicated LAD system: normal Left Ventriculogram LCX system: normal Ejection Fraction: 71%  COMMENT The LAD curls the Apex. The RCA and Cirx arteries  both contribute small PDA's.      07/2013 Echo   LVEF 45-50%, mild LVH, grade I diastolic dysfunction, hypokinesis of the apical anterior and apical latera walls.      12/2015 cath  Mid RCA-1 lesion, 30 %stenosed.  A STENT SYNERGY DES 2.75X20 drug eluting stent was successfully placed.  Mid RCA-2 lesion, 80 %stenosed.  Post intervention, there is a 0% residual stenosis.  The left ventricular systolic function is normal.  LV end diastolic pressure is normal.  The left ventricular ejection fraction is 55-65% by visual estimate.  There is no mitral valve regurgitation.   Normal LV function with an  ejection fraction of approximately 55-60%. LVEDP 13 mm Hg.   Normal left coronary coronary circulation with a large LAD and circumflex vessel and diminutive ramus intermediate vessel.   Single vessel CAD with 30% followed by 80% mid RCA stenosis.   Successful PCI with PTCA/DES stenting of the mid RCA with ultimate insertion of a 2.7x 20 mm Synergy DES stent postdilated to 2.92 mm with the stenoses being reduced to 0%.   Postcath RECOMMENDATION: The patient will continue with dual antiplatelet and medical therapy.  Aggressive lipid intervention with a target LDL less than 70 will be undertaken.  The patient will follow up with Dr. Carlyle Dolly.     07/2016 Exercise nuclear stress  Blood pressure demonstrated a hypertensive response to exercise.  There was no ST segment deviation noted during stress.  The study is normal. There are no perfusion defects consistent with prior infarct or current ishcemia.  This is a low risk study.  The left ventricular ejection fraction is hyperdynamic (>65%).           ASSESSMENT:    1. CAD in native artery   2. Essential hypertension   3. Chronic diastolic CHF (congestive heart failure) ([place])   4. Gastrointestinal hemorrhage, unspecified gastrointestinal hemorrhage type      PLAN:  In order of problems listed above:  CAD status  post DES to the RCA 12/2015 on Brilinta and aspirin, normal nuclear stress test 07/2016.  Follow-up with Dr. Harl Monroe in February.  Hypertension patient's blood pressure is up today.  He says it has been normal at home and at other doctor's visits.  He says he did not sleep because of an upper respiratory infection.  He is also on double beta blockers since discharge from the hospital.  Stop metoprolol and continue carvedilol.  He will check his blood pressures at home and call us if they are elevated.  Chronic diastolic CHF well compensated  Recent GI bleed secondary to colon mass status post hemicolectomy Thanksgiving day at [facility].  Tumor was suspicious for malignancy but turned out to be benign.  Patient was placed back on his Brilinta and aspirin.  He had blood work last week and all has been stable.  No evidence of any further bleeding.   Medication Adjustments/Labs and Tests Ordered: Current medicines are reviewed at length with the patient today.  Concerns regarding medicines are outlined above.  Medication changes, Labs and Tests ordered today are listed in the Patient Instructions below. Patient Instructions  Medication Instructions:  Your physician recommends that you continue on your current medications as directed. Please refer to the Current Medication list given to you today.   Labwork: NONE   Testing/Procedures: NONE   Follow-Up: Your physician recommends that you schedule a follow-up appointment in: 1 Month with Dr. Harl Monroe.    Any Other Special Instructions Will Be Listed Below (If Applicable). Your physician has requested that you regularly monitor and record your blood pressure readings at home. Please use the same machine at the same time of day to check your readings and record them to bring to your follow-up visit.      If you need a refill on your cardiac medications before your next appointment, please call your pharmacy.  Thank you for choosing  [facility]!      Sumner Boast, PA-C  05/09/2017 11:11 AM    [facility] [address], [place], [place]  [address] Phone: [phone]; Fax: [phone]

## 2017-05-09 NOTE — Patient Instructions (Addendum)
Medication Instructions:  Your physician recommends that you continue on your current medications as directed. Please refer to the Current Medication list given to you today.   Labwork: NONE   Testing/Procedures: NONE   Follow-Up: Your physician recommends that you schedule a follow-up appointment in: 1 Month with Dr. Harl Bowie.    Any Other Special Instructions Will Be Listed Below (If Applicable). Your physician has requested that you regularly monitor and record your blood pressure readings at home. Please use the same machine at the same time of day to check your readings and record them to bring to your follow-up visit.      If you need a refill on your cardiac medications before your next appointment, please call your pharmacy.  Thank you for choosing [facility]!

## 2017-05-24 ENCOUNTER — Other Ambulatory Visit: Payer: Self-pay | Admitting: Cardiology

## 2017-06-25 ENCOUNTER — Telehealth: Payer: Self-pay | Admitting: Cardiology

## 2017-06-25 NOTE — Telephone Encounter (Signed)
Patient notified that new rx was sent in on 05/24/2017 for a 90 day supply with 3 refills.  Family member will call pharmacy back with this information.

## 2017-06-25 NOTE — Telephone Encounter (Signed)
Patient called stating that he has no refills on Potassium 25 mg . Is he suppose to continue taking these?  Pharmacy [facility] [place], [place]

## 2017-07-02 ENCOUNTER — Encounter: Payer: Self-pay | Admitting: Cardiology

## 2017-07-02 ENCOUNTER — Ambulatory Visit (INDEPENDENT_AMBULATORY_CARE_PROVIDER_SITE_OTHER): Payer: Medicare Other | Admitting: Cardiology

## 2017-07-02 VITALS — BP 112/80 | HR 80 | Ht 67.0 in | Wt 214.0 lb

## 2017-07-02 DIAGNOSIS — I2510 Atherosclerotic heart disease of native coronary artery without angina pectoris: Secondary | ICD-10-CM

## 2017-07-02 DIAGNOSIS — E782 Mixed hyperlipidemia: Secondary | ICD-10-CM

## 2017-07-02 DIAGNOSIS — I10 Essential (primary) hypertension: Secondary | ICD-10-CM | POA: Diagnosis not present

## 2017-07-02 DIAGNOSIS — I5032 Chronic diastolic (congestive) heart failure: Secondary | ICD-10-CM | POA: Diagnosis not present

## 2017-07-02 NOTE — Patient Instructions (Signed)
Your physician wants you to follow-up in: 6 MONTHS WITH DR BRANCH You will receive a reminder letter in the mail two months in advance. If you don't receive a letter, please call our office to schedule the follow-up appointment.  Your physician has recommended you make the following change in your medication:   STOP BRILINTA  Thank you for choosing [facility]!!    

## 2017-07-02 NOTE — Progress Notes (Signed)
Clinical Summary Mr. Aversa is a 72 y.o.male seen today for follow up of the following medical problems.   1. CAD - notes indicate remote history of MI in early 65s in [place] at [place]. Transferred to [facility] at that time,cath at that time showed patent coronaries - Received DES to RCA 12/2015. LVEF by LVgram at that time 55-65% - 07/2016 exercise nuclear stress: no ischemia   - no recent chest pain. Improved breathing since last visit, inhalers recently changed by Dr Luan Pulling.  - compliant with meds  2. HTN  -compliant with meds  3. OSA  - [facility]   4. Hyperlipidemia - 07/2016 TC 91 TG 117 HDL 38 LDL 30 - compliant with statin   5. DM2 - followed by pcp  6. SOB - abnormal PFTs as described below. Followed by Dr Luan Pulling.  - changed inhalers last visit, f/u in 3 weeks.   7. Colon cancer - recent surgery in [place], [place] Past Medical History:  Diagnosis Date  . Agent orange exposure [id number]  . Anxiety disorder   . Arthritis    "back, knees, shoulders" (01/06/2016)  . Asthma   . CAD (coronary artery disease)    a. remote MI 20 with OK cath at that time. b. LHC 01/06/16:  PTCA/DES to mRCA, left coronary system OK, LVEF 55-65%.   . Cancer, colon ([place])   . Cardiomyopathy ([place])    a. EF 45-50% in 2015. b. LVEF 55-65% in 2017.  Marland Kitchen Chronic lower back pain   . GERD (gastroesophageal reflux disease)   . Hypertension   . Hypothyroidism    "got nuclear treatment in the 70's"  . Myocardial infarction ([place]) 04/1990    in [place] at [place]/notes 08/21/2013  . OSA on CPAP   . Prediabetes      Allergies  Allergen Reactions  . Prednisone Other (See Comments)    Tablet form only per patient (paranoid).  Injection form is fine.      Current Outpatient Medications  Medication Sig Dispense Refill  . Albuterol Sulfate (VENTOLIN HFA IN) Inhale 1 puff into the lungs every 6 (six) hours as needed (wheezing).     Marland Kitchen  aspirin EC 81 MG tablet Take 81 mg by mouth daily.    Marland Kitchen atorvastatin (LIPITOR) 80 MG tablet TAKE 1 TABLET BY MOUTH EVERY EVENING 30 tablet 6  . BRILINTA 90 MG TABS tablet TAKE 1 TABLET TWICE A DAY 180 tablet 1  . Calcium Carbonate-Vitamin D (CALTRATE 600+D PO) Take 1 tablet by mouth daily.     . carvedilol (COREG) 25 MG tablet Take 1 tablet (25 mg total) by mouth 2 (two) times daily. 180 tablet 3  . Cholecalciferol (VITAMIN D3) 2000 units TABS Take 1 tablet by mouth daily.    . cyclobenzaprine (FLEXERIL) 10 MG tablet Take 10 mg by mouth at bedtime.    . furosemide (LASIX) 20 MG tablet TAKE 40 MG FOR 3 DAYS THEN RESUME 20 MG DAILY MAY TAKE ADDITIONAL 20 MG AS NEEDED 96 tablet 1  . gabapentin (NEURONTIN) 300 MG capsule Take 1 capsule by mouth at bedtime.  2  . glipiZIDE (GLUCOTROL) 5 MG tablet Take by mouth daily before breakfast.    . HYDROcodone-acetaminophen (NORCO) 7.5-325 MG per tablet Take 1 tablet by mouth 2 (two) times daily as needed for moderate pain.    Marland Kitchen LORazepam (ATIVAN) 1 MG tablet Take 1 mg by mouth at bedtime.    Marland Kitchen losartan (  COZAAR) 25 MG tablet TAKE 1 TABLET BY MOUTH EVERY DAY 90 tablet 3  . metoprolol tartrate (LOPRESSOR) 25 MG tablet Take 25 mg by mouth 2 (two) times daily.    . Multiple Vitamin (MULTIVITAMIN) tablet Take 1 tablet by mouth daily.    . nisoldipine (SULAR) 17 MG 24 hr tablet Take 17 mg by mouth daily.    . nitroGLYCERIN (NITROSTAT) 0.4 MG SL tablet Place 1 tablet (0.4 mg total) under the tongue every 5 (five) minutes as needed for chest pain. 25 tablet 3  . omeprazole (PRILOSEC) 20 MG capsule Take 20 mg by mouth daily.    Marland Kitchen terazosin (HYTRIN) 1 MG capsule Take 1 mg by mouth at bedtime.     No current facility-administered medications for this visit.      Past Surgical History:  Procedure Laterality Date  . APPENDECTOMY  1948  . BACK SURGERY    . CARDIAC CATHETERIZATION  05/1990  . CARDIAC CATHETERIZATION N/A 01/06/2016   Procedure: Left Heart Cath and  Coronary Angiography;  Surgeon: Troy Sine, MD;  Location: [facility];  Service: Cardiovascular;  Laterality: N/A;  . CARDIAC CATHETERIZATION N/A 01/06/2016   Procedure: Coronary Stent Intervention;  Surgeon: Troy Sine, MD;  Location: [facility];  Service: Cardiovascular;  Laterality: N/A;  RCA mid  . CATARACT EXTRACTION W/ INTRAOCULAR LENS  IMPLANT, BILATERAL Bilateral   . POSTERIOR LUMBAR FUSION  08/1982; 05/1996     Allergies  Allergen Reactions  . Prednisone Other (See Comments)    Tablet form only per patient (paranoid).  Injection form is fine.       Family History  Problem Relation Age of Onset  . Heart attack Father        MID 70'S  . Cancer Father   . Diabetes Mother   . Stroke Mother   . Diabetes Brother        X'S 2     Social History Mr. Cinque reports that he quit smoking about 15 years ago. His smoking use included cigars. He quit after 35.00 years of use. he has never used smokeless tobacco. Mr. Macomber reports that he drinks alcohol.   Review of Systems CONSTITUTIONAL: No weight loss, fever, chills, weakness or fatigue.  HEENT: Eyes: No visual loss, blurred vision, double vision or yellow sclerae.No hearing loss, sneezing, congestion, runny nose or sore throat.  SKIN: No rash or itching.  CARDIOVASCULAR: per hpi RESPIRATORY: per hpi GASTROINTESTINAL: No anorexia, nausea, vomiting or diarrhea. No abdominal pain or blood.  GENITOURINARY: No burning on urination, no polyuria NEUROLOGICAL: No headache, dizziness, syncope, paralysis, ataxia, numbness or tingling in the extremities. No change in bowel or bladder control.  MUSCULOSKELETAL: No muscle, back pain, joint pain or stiffness.  LYMPHATICS: No enlarged nodes. No history of splenectomy.  PSYCHIATRIC: No history of depression or anxiety.  ENDOCRINOLOGIC: No reports of sweating, cold or heat intolerance. No polyuria or polydipsia.  Marland Kitchen   Physical Examination Vitals:   07/02/17  1051  BP: 112/80  Pulse: 80  SpO2: 96%   Vitals:   07/02/17 1051  Weight: 214 lb (97.1 kg)  Height: 5\' 7"  (1.702 m)    Gen: resting comfortably, no acute distress HEENT: no scleral icterus, pupils equal round and reactive, no palptable cervical adenopathy,  CV: RRR, no m/r/g, no jvd Resp: Clear to auscultation bilaterally GI: abdomen is soft, non-tender, non-distended, normal bowel sounds, no hepatosplenomegaly MSK: extremities are warm, no edema.  Skin: warm, no  rash Neuro:  no focal deficits Psych: appropriate affect   Diagnostic Studies [facility] Cath 05/30/1990 DIAGNOSTIC SUMMARY Coronary Artery Disease RCA system: normal Left Main: normal No significant CAD indicated LAD system: normal Left Ventriculogram LCX system: normal Ejection Fraction: 71%  COMMENT The LAD curls the Apex. The RCA and Cirx arteries both contribute small PDA's.   07/2013 Echo LVEF 45-50%, mild LVH, grade I diastolic dysfunction, hypokinesis of the apicalanterior and apical latera walls.    12/2015 cath  Mid RCA-1 lesion, 30 %stenosed.  A STENT SYNERGY DES 2.75X20 drug eluting stent was successfully placed.  Mid RCA-2 lesion, 80 %stenosed.  Post intervention, there is a 0% residual stenosis.  The left ventricular systolic function is normal.  LV end diastolic pressure is normal.  The left ventricular ejection fraction is 55-65% by visual estimate.  There is no mitral valve regurgitation.  Normal LV function with an ejection fraction of approximately 55-60%. LVEDP 13 mm Hg.  Normal left coronary coronary circulation with a large LAD and circumflex vessel and diminutive ramus intermediate vessel.  Single vessel CAD with 30% followed by 80% mid RCA stenosis.  Successful PCI with PTCA/DES stenting of the mid RCA with ultimate insertion of a 2.7x 20 mm Synergy DES stent postdilated to 2.92 mm with the stenoses being reduced to 0%.  Postcath RECOMMENDATION: The patient will  continue with dual antiplatelet and medical therapy. Aggressive lipid intervention with a target LDL less than 70 will be undertaken. The patient will follow up with Dr. Carlyle Dolly.   07/2016 Exercise nuclear stress  Blood pressure demonstrated a hypertensive response to exercise.  There was no ST segment deviation noted during stress.  The study is normal. There are no perfusion defects consistent with prior infarct or current ishcemia.  This is a low risk study.  The left ventricular ejection fraction is hyperdynamic (>65%).   01/2017 PFTs Mild ventilatory defect without obstruction, moderately reduced TLC, high airway resistance.    Assessment and Plan  1. CAD - DES to RCA 01/06/16. He will stop brillinta, has completed over 1 year -no symptoms,continue other meds  2. Chronic diastolic HF - no symptoms, continue current meds  3. HTN  -at goal, continue current meds  4. Hyperlipidemia -request labs from [place], continue statin  5. SOB/Wheezing - abnormal PFTs, continue to f/u with pulmonary   F/u 6 months   Arnoldo Lenis, M.D.

## 2017-07-05 ENCOUNTER — Encounter: Payer: Self-pay | Admitting: Cardiology

## 2017-09-11 ENCOUNTER — Other Ambulatory Visit: Payer: Self-pay | Admitting: Cardiology

## 2017-10-02 ENCOUNTER — Telehealth: Payer: Self-pay

## 2017-10-02 NOTE — Telephone Encounter (Signed)
Yes that is fine   J BrancH MD

## 2017-10-02 NOTE — Telephone Encounter (Addendum)
Pt to have myelogram, [facility] nurse Fraser Din called , states radiologist asking if pt can be off ASA 5 days prior and 24 hrs after procedure   Contact Fraser Din or Izora Gala at [phone] ext [phone]

## 2017-10-03 NOTE — Telephone Encounter (Signed)
Had to call and get correct fax number from [place]. Sent fax again.

## 2017-10-03 NOTE — Telephone Encounter (Signed)
Asking about letter.  They have not received fax

## 2017-10-03 NOTE — Telephone Encounter (Signed)
Faxed letter to [facility]. 5/7

## 2017-12-22 IMAGING — NM NM MYOCAR MULTI W/SPECT W/WALL MOTION & EF
2 series · 12 of 12 positions shown · non-contrast
Comparison: none

[Series 1: rest · 8.28mm/px · 6 of 64 frames shown]
[frame 6/64]
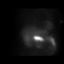
[frame 16/64]
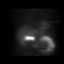
[frame 27/64]
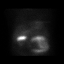
[frame 38/64]
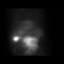
[frame 48/64]
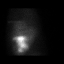
[frame 59/64]
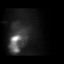

[Series 2: stress gated · 8.28mm/px · 6 of 64 frames shown]
[frame 6/64]
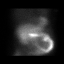
[frame 16/64]
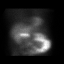
[frame 27/64]
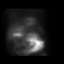
[frame 38/64]
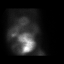
[frame 48/64]
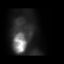
[frame 59/64]
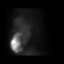

[12 of 12 positions shown; findings below may reference images not displayed]

Canned report from images found in remote index.

Refer to host system for actual result text.

## 2018-01-03 ENCOUNTER — Ambulatory Visit: Payer: Medicare Other | Admitting: Cardiology

## 2018-01-04 ENCOUNTER — Encounter: Payer: Self-pay | Admitting: Cardiology

## 2018-01-04 ENCOUNTER — Ambulatory Visit (INDEPENDENT_AMBULATORY_CARE_PROVIDER_SITE_OTHER): Payer: Medicare Other | Admitting: Cardiology

## 2018-01-04 VITALS — BP 136/73 | HR 68 | Ht 66.0 in | Wt 219.6 lb

## 2018-01-04 DIAGNOSIS — E782 Mixed hyperlipidemia: Secondary | ICD-10-CM

## 2018-01-04 DIAGNOSIS — I2510 Atherosclerotic heart disease of native coronary artery without angina pectoris: Secondary | ICD-10-CM

## 2018-01-04 DIAGNOSIS — I5032 Chronic diastolic (congestive) heart failure: Secondary | ICD-10-CM

## 2018-01-04 DIAGNOSIS — I10 Essential (primary) hypertension: Secondary | ICD-10-CM

## 2018-01-04 NOTE — Progress Notes (Signed)
Clinical Summary Mr. Hayhurst is a 72 y.o.male seen today for follow up of the following medical problems.   1. CAD - notes indicate remote history of MI in early 36s in [place] at [place]. Transferred to [facility] at that time,cath at that time showed patent coronaries - Received DES to RCA 12/2015. LVEF by LVgram at that time 55-65% - 07/2016 exercise nuclear stress: no ischemia  - no recent chest pain. No recent SOB.  - compliant with meds  2. HTN  -he is compliant with meds  3. OSA  - [facility]   4. Hyperlipidemia - 07/2016 TC 91 TG 117 HDL 38 LDL 30 - [facility]  5. DM2 - followed by pcp  6. SOB - abnormal PFTs as described below. Followed by Dr Luan Pulling.  - changed inhalers last visit, f/u in 3 weeks.   - followed by Dr Luan Pulling.   7. Colon cancer - recent surgery in [place], [place]   8. AAA screen  - Jan 2015 CT abd no AAA reported   Past Medical History:  Diagnosis Date  . Agent orange exposure [id number]  . Anxiety disorder   . Arthritis    "back, knees, shoulders" (01/06/2016)  . Asthma   . CAD (coronary artery disease)    a. remote MI 76 with OK cath at that time. b. LHC 01/06/16:  PTCA/DES to mRCA, left coronary system OK, LVEF 55-65%.   . Cancer, colon ([place])   . Cardiomyopathy ([place])    a. EF 45-50% in 2015. b. LVEF 55-65% in 2017.  Marland Kitchen Chronic lower back pain   . GERD (gastroesophageal reflux disease)   . Hypertension   . Hypothyroidism    "got nuclear treatment in the 70's"  . Myocardial infarction ([place]) 04/1990    in [place] at [place]/notes 08/21/2013  . OSA on CPAP   . Prediabetes      Allergies  Allergen Reactions  . Prednisone Other (See Comments)    Tablet form only per patient (paranoid).  Injection form is fine.      Current Outpatient Medications  Medication Sig Dispense Refill  . aspirin EC 81 MG tablet Take 81 mg by mouth daily.    Marland Kitchen atorvastatin  (LIPITOR) 80 MG tablet TAKE 1 TABLET BY MOUTH EVERY EVENING 30 tablet 6  . BRILINTA 90 MG TABS tablet TAKE 1 TABLET TWICE A DAY 180 tablet 1  . budesonide-formoterol (SYMBICORT) 160-4.5 MCG/ACT inhaler Inhale 2 puffs into the lungs 2 (two) times daily.    . Calcium Carbonate-Vitamin D (CALTRATE 600+D PO) Take 1 tablet by mouth daily.     . carvedilol (COREG) 25 MG tablet Take 1 tablet (25 mg total) by mouth 2 (two) times daily. 180 tablet 3  . Cholecalciferol (VITAMIN D3) 2000 units TABS Take 1 tablet by mouth daily.    . cyclobenzaprine (FLEXERIL) 10 MG tablet Take 10 mg by mouth at bedtime.    . furosemide (LASIX) 20 MG tablet TAKE 40 MG FOR 3 DAYS THEN RESUME 20 MG DAILY MAY TAKE ADDITIONAL 20 MG AS NEEDED 96 tablet 1  . gabapentin (NEURONTIN) 300 MG capsule Take 1 capsule by mouth at bedtime.  2  . glipiZIDE (GLUCOTROL) 5 MG tablet Take by mouth daily before breakfast.    . HYDROcodone-acetaminophen (NORCO) 7.5-325 MG per tablet Take 1 tablet by mouth 2 (two) times daily as needed for moderate pain.    Marland Kitchen  losartan (COZAAR) 25 MG tablet TAKE 1 TABLET BY MOUTH EVERY DAY 90 tablet 3  . meloxicam (MOBIC) 15 MG tablet Take 15 mg by mouth daily.    . Multiple Vitamin (MULTIVITAMIN) tablet Take 1 tablet by mouth daily.    . nisoldipine (SULAR) 17 MG 24 hr tablet Take 17 mg by mouth daily.    . nitroGLYCERIN (NITROSTAT) 0.4 MG SL tablet Place 1 tablet (0.4 mg total) under the tongue every 5 (five) minutes as needed for chest pain. 25 tablet 3  . omeprazole (PRILOSEC) 20 MG capsule Take 20 mg by mouth daily.    Marland Kitchen terazosin (HYTRIN) 1 MG capsule Take 1 mg by mouth at bedtime.     No current facility-administered medications for this visit.      Past Surgical History:  Procedure Laterality Date  . APPENDECTOMY  1948  . BACK SURGERY    . CARDIAC CATHETERIZATION  05/1990  . CARDIAC CATHETERIZATION N/A 01/06/2016   Procedure: Left Heart Cath and Coronary Angiography;  Surgeon: Troy Sine, MD;   Location: [facility];  Service: Cardiovascular;  Laterality: N/A;  . CARDIAC CATHETERIZATION N/A 01/06/2016   Procedure: Coronary Stent Intervention;  Surgeon: Troy Sine, MD;  Location: [facility];  Service: Cardiovascular;  Laterality: N/A;  RCA mid  . CATARACT EXTRACTION W/ INTRAOCULAR LENS  IMPLANT, BILATERAL Bilateral   . POSTERIOR LUMBAR FUSION  08/1982; 05/1996     Allergies  Allergen Reactions  . Prednisone Other (See Comments)    Tablet form only per patient (paranoid).  Injection form is fine.       Family History  Problem Relation Age of Onset  . Heart attack Father        MID 70'S  . Cancer Father   . Diabetes Mother   . Stroke Mother   . Diabetes Brother        X'S 2     Social History Mr. Mackie reports that he quit smoking about 15 years ago. His smoking use included cigars. He quit after 35.00 years of use. He has never used smokeless tobacco. Mr. Costanzo reports that he drinks alcohol.   Review of Systems CONSTITUTIONAL: No weight loss, fever, chills, weakness or fatigue.  HEENT: Eyes: No visual loss, blurred vision, double vision or yellow sclerae.No hearing loss, sneezing, congestion, runny nose or sore throat.  SKIN: No rash or itching.  CARDIOVASCULAR: per hpi RESPIRATORY: No shortness of breath, cough or sputum.  GASTROINTESTINAL: No anorexia, nausea, vomiting or diarrhea. No abdominal pain or blood.  GENITOURINARY: No burning on urination, no polyuria NEUROLOGICAL: No headache, dizziness, syncope, paralysis, ataxia, numbness or tingling in the extremities. No change in bowel or bladder control.  MUSCULOSKELETAL: No muscle, back pain, joint pain or stiffness.  LYMPHATICS: No enlarged nodes. No history of splenectomy.  PSYCHIATRIC: No history of depression or anxiety.  ENDOCRINOLOGIC: No reports of sweating, cold or heat intolerance. No polyuria or polydipsia.  Marland Kitchen   Physical Examination Vitals:   01/04/18 1328  BP: 136/73    Pulse: 68  SpO2: 94%   Vitals:   01/04/18 1328  Weight: 219 lb 9.6 oz (99.6 kg)  Height: 5\' 6"  (1.676 m)    Gen: resting comfortably, no acute distress HEENT: no scleral icterus, pupils equal round and reactive, no palptable cervical adenopathy,  CV: RRR, no m/r/g, no jvd Resp: Clear to auscultation bilaterally GI: abdomen is soft, non-tender, non-distended, normal bowel sounds, no hepatosplenomegaly MSK: extremities are warm,  no edema.  Skin: warm, no rash Neuro:  no focal deficits Psych: appropriate affect   Diagnostic Studies [facility] Cath 05/30/1990 DIAGNOSTIC SUMMARY Coronary Artery Disease RCA system: normal Left Main: normal No significant CAD indicated LAD system: normal Left Ventriculogram LCX system: normal Ejection Fraction: 71%  COMMENT The LAD curls the Apex. The RCA and Cirx arteries both contribute small PDA's.   07/2013 Echo LVEF 45-50%, mild LVH, grade I diastolic dysfunction, hypokinesis of the apicalanterior and apical latera walls.    12/2015 cath  Mid RCA-1 lesion, 30 %stenosed.  A STENT SYNERGY DES 2.75X20 drug eluting stent was successfully placed.  Mid RCA-2 lesion, 80 %stenosed.  Post intervention, there is a 0% residual stenosis.  The left ventricular systolic function is normal.  LV end diastolic pressure is normal.  The left ventricular ejection fraction is 55-65% by visual estimate.  There is no mitral valve regurgitation.  Normal LV function with an ejection fraction of approximately 55-60%. LVEDP 13 mm Hg.  Normal left coronary coronary circulation with a large LAD and circumflex vessel and diminutive ramus intermediate vessel.  Single vessel CAD with 30% followed by 80% mid RCA stenosis.  Successful PCI with PTCA/DES stenting of the mid RCA with ultimate insertion of a 2.7x 20 mm Synergy DES stent postdilated to 2.92 mm with the stenoses being reduced to 0%.  Postcath RECOMMENDATION: The patient will continue  with dual antiplatelet and medical therapy. Aggressive lipid intervention with a target LDL less than 70 will be undertaken. The patient will follow up with Dr. Carlyle Dolly.   07/2016 Exercise nuclear stress  Blood pressure demonstrated a hypertensive response to exercise.  There was no ST segment deviation noted during stress.  The study is normal. There are no perfusion defects consistent with prior infarct or current ishcemia.  This is a low risk study.  The left ventricular ejection fraction is hyperdynamic (>65%).   01/2017 PFTs Mild ventilatory defect without obstruction, moderately reduced TLC, high airway resistance.      Assessment and Plan  1. CAD - DES to RCA 01/06/16. - no recent symptoms, continue current meds  2. Chronic diastolic HF - doing well without symptoms, continue current meds. Taking lasix just prn about 2 times a week  3. HTN  -bp overall at goal,continue current meds  4. Hyperlipidemia -continues statni, [facility].   F/u 1 year      Arnoldo Lenis, M.D.

## 2018-01-04 NOTE — Patient Instructions (Signed)
Medication Instructions:  Your physician recommends that you continue on your current medications as directed. Please refer to the Current Medication list given to you today.  Labwork: none  Testing/Procedures: none  Follow-Up: Your physician wants you to follow-up in: 1 YEAR WITH DR. BRANCH You will receive a reminder letter in the mail two months in advance. If you don't receive a letter, please call our office to schedule the follow-up appointment.   Any Other Special Instructions Will Be Listed Below (If Applicable).  If you need a refill on your cardiac medications before your next appointment, please call your pharmacy. 

## 2018-06-18 ENCOUNTER — Other Ambulatory Visit: Payer: Self-pay | Admitting: Cardiology

## 2019-01-28 ENCOUNTER — Ambulatory Visit (INDEPENDENT_AMBULATORY_CARE_PROVIDER_SITE_OTHER): Payer: Medicare Other | Admitting: Cardiology

## 2019-01-28 ENCOUNTER — Encounter: Payer: Self-pay | Admitting: Cardiology

## 2019-01-28 ENCOUNTER — Other Ambulatory Visit: Payer: Self-pay

## 2019-01-28 VITALS — BP 148/75 | HR 79 | Ht 68.0 in | Wt 222.6 lb

## 2019-01-28 DIAGNOSIS — I5032 Chronic diastolic (congestive) heart failure: Secondary | ICD-10-CM

## 2019-01-28 DIAGNOSIS — I10 Essential (primary) hypertension: Secondary | ICD-10-CM

## 2019-01-28 DIAGNOSIS — E782 Mixed hyperlipidemia: Secondary | ICD-10-CM

## 2019-01-28 DIAGNOSIS — I2510 Atherosclerotic heart disease of native coronary artery without angina pectoris: Secondary | ICD-10-CM

## 2019-01-28 MED ORDER — NITROGLYCERIN 0.4 MG SL SUBL: 0.4000 mg | SUBLINGUAL_TABLET | SUBLINGUAL | 3 refills | Status: AC | PRN

## 2019-01-28 NOTE — Progress Notes (Signed)
Clinical Summary Mr. Weideman is a 73 y.o.male seen today for follow up of the following medical problems.   1. CAD - notes indicate remote history of MI in early 85s in [place] at [place]. Transferred to [facility] at that time,cath at that time showed patent coronaries - Received DES to RCA 12/2015. LVEF by LVgram at that time 55-65% - 07/2016 exercise nuclear stress: no ischemia   - no recent chest pain. No sob or doe - compliant with meds   2. HTN  -compliant with meds - takes ibuprofen bid.    3. OSA  - [facility]   4. Hyperlipidemia - 07/2016 TC 91 TG 117 HDL 38 LDL 30 - he reports recent labs with pcp - compliant with statin  5. DM2 - followed by pcp  6. SOB - abnormal PFTs asdescribed below. Followed by Dr Luan Pulling.    7. Colon cancer - prior surgery in [place], [place]   8. AAA screen  - Jan 2015 CT abd no AAA reported    Past Medical History:  Diagnosis Date  . Agent orange exposure [id number]  . Anxiety disorder   . Arthritis    "back, knees, shoulders" (01/06/2016)  . Asthma   . CAD (coronary artery disease)    a. remote MI 48 with OK cath at that time. b. LHC 01/06/16:  PTCA/DES to mRCA, left coronary system OK, LVEF 55-65%.   . Cancer, colon ([place])   . Cardiomyopathy ([place])    a. EF 45-50% in 2015. b. LVEF 55-65% in 2017.  Marland Kitchen Chronic lower back pain   . GERD (gastroesophageal reflux disease)   . Hypertension   . Hypothyroidism    "got nuclear treatment in the 70's"  . Myocardial infarction ([place]) 04/1990    in [place] at [place]/notes 08/21/2013  . OSA on CPAP   . Prediabetes      Allergies  Allergen Reactions  . Prednisone Other (See Comments)    Tablet form only per patient (paranoid).  Injection form is fine.      Current Outpatient Medications  Medication Sig Dispense Refill  . aspirin EC 81 MG tablet Take 81 mg by mouth daily.    Marland Kitchen atorvastatin (LIPITOR) 80 MG tablet TAKE 1  TABLET BY MOUTH EVERY EVENING 30 tablet 6  . budesonide-formoterol (SYMBICORT) 160-4.5 MCG/ACT inhaler Inhale 2 puffs into the lungs 2 (two) times daily.    . Calcium Carbonate-Vitamin D (CALTRATE 600+D PO) Take 1 tablet by mouth daily.     . carvedilol (COREG) 25 MG tablet Take 1 tablet (25 mg total) by mouth 2 (two) times daily. 180 tablet 3  . Cholecalciferol (VITAMIN D3) 2000 units TABS Take 1 tablet by mouth daily.    . cyclobenzaprine (FLEXERIL) 10 MG tablet Take 10 mg by mouth at bedtime.    . furosemide (LASIX) 20 MG tablet TAKE 40 MG FOR 3 DAYS THEN RESUME 20 MG DAILY MAY TAKE ADDITIONAL 20 MG AS NEEDED 96 tablet 1  . gabapentin (NEURONTIN) 300 MG capsule Take 1 capsule by mouth at bedtime.  2  . glipiZIDE (GLUCOTROL) 5 MG tablet Take by mouth daily before breakfast.    . HYDROcodone-acetaminophen (NORCO) 7.5-325 MG per tablet Take 1 tablet by mouth 2 (two) times daily as needed for moderate pain.    Marland Kitchen ibuprofen (ADVIL,MOTRIN) 800 MG tablet Take 800 mg by mouth 2 (two) times daily as needed.    Marland Kitchen losartan (COZAAR) 25 MG tablet  TAKE 1 TABLET BY MOUTH EVERY DAY 90 tablet 3  . Multiple Vitamin (MULTIVITAMIN) tablet Take 1 tablet by mouth daily.    . nisoldipine (SULAR) 17 MG 24 hr tablet Take 17 mg by mouth daily.    . nitroGLYCERIN (NITROSTAT) 0.4 MG SL tablet Place 1 tablet (0.4 mg total) under the tongue every 5 (five) minutes as needed for chest pain. 25 tablet 3  . omeprazole (PRILOSEC) 20 MG capsule Take 20 mg by mouth daily.    Marland Kitchen terazosin (HYTRIN) 1 MG capsule Take 1 mg by mouth at bedtime.     No current facility-administered medications for this visit.      Past Surgical History:  Procedure Laterality Date  . APPENDECTOMY  1948  . BACK SURGERY    . CARDIAC CATHETERIZATION  05/1990  . CARDIAC CATHETERIZATION N/A 01/06/2016   Procedure: Left Heart Cath and Coronary Angiography;  Surgeon: Troy Sine, MD;  Location: [facility];  Service: Cardiovascular;  Laterality:  N/A;  . CARDIAC CATHETERIZATION N/A 01/06/2016   Procedure: Coronary Stent Intervention;  Surgeon: Troy Sine, MD;  Location: [facility];  Service: Cardiovascular;  Laterality: N/A;  RCA mid  . CATARACT EXTRACTION W/ INTRAOCULAR LENS  IMPLANT, BILATERAL Bilateral   . POSTERIOR LUMBAR FUSION  08/1982; 05/1996     Allergies  Allergen Reactions  . Prednisone Other (See Comments)    Tablet form only per patient (paranoid).  Injection form is fine.       Family History  Problem Relation Age of Onset  . Heart attack Father        MID 70'S  . Cancer Father   . Diabetes Mother   . Stroke Mother   . Diabetes Brother        X'S 2     Social History Mr. Bessler reports that he quit smoking about 16 years ago. His smoking use included cigars. He quit after 35.00 years of use. He has never used smokeless tobacco. Mr. Mickels reports current alcohol use.   Review of Systems CONSTITUTIONAL: No weight loss, fever, chills, weakness or fatigue.  HEENT: Eyes: No visual loss, blurred vision, double vision or yellow sclerae.No hearing loss, sneezing, congestion, runny nose or sore throat.  SKIN: No rash or itching.  CARDIOVASCULAR: per hpi RESPIRATORY: No shortness of breath, cough or sputum.  GASTROINTESTINAL: No anorexia, nausea, vomiting or diarrhea. No abdominal pain or blood.  GENITOURINARY: No burning on urination, no polyuria NEUROLOGICAL: No headache, dizziness, syncope, paralysis, ataxia, numbness or tingling in the extremities. No change in bowel or bladder control.  MUSCULOSKELETAL: No muscle, back pain, joint pain or stiffness.  LYMPHATICS: No enlarged nodes. No history of splenectomy.  PSYCHIATRIC: No history of depression or anxiety.  ENDOCRINOLOGIC: No reports of sweating, cold or heat intolerance. No polyuria or polydipsia.  Marland Kitchen   Physical Examination Today's Vitals   01/28/19 1301  BP: (!) 148/75  Pulse: 79  SpO2: 96%  Weight: 222 lb 9.6 oz (101 kg)   Height: 5\' 8"  (1.727 m)   Body mass index is 33.85 kg/m.  Gen: resting comfortably, no acute distress HEENT: no scleral icterus, pupils equal round and reactive, no palptable cervical adenopathy,  CV: RRR, no m/r/g, no jvd Resp: Clear to auscultation bilaterally GI: abdomen is soft, non-tender, non-distended, normal bowel sounds, no hepatosplenomegaly MSK: extremities are warm, no edema.  Skin: warm, no rash Neuro:  no focal deficits Psych: appropriate affect   Diagnostic Studies  [facility]  Cath 05/30/1990 DIAGNOSTIC SUMMARY Coronary Artery Disease RCA system: normal Left Main: normal No significant CAD indicated LAD system: normal Left Ventriculogram LCX system: normal Ejection Fraction: 71%  COMMENT The LAD curls the Apex. The RCA and Cirx arteries both contribute small PDA's.   07/2013 Echo LVEF 45-50%, mild LVH, grade I diastolic dysfunction, hypokinesis of the apicalanterior and apical latera walls.    12/2015 cath  Mid RCA-1 lesion, 30 %stenosed.  A STENT SYNERGY DES 2.75X20 drug eluting stent was successfully placed.  Mid RCA-2 lesion, 80 %stenosed.  Post intervention, there is a 0% residual stenosis.  The left ventricular systolic function is normal.  LV end diastolic pressure is normal.  The left ventricular ejection fraction is 55-65% by visual estimate.  There is no mitral valve regurgitation.  Normal LV function with an ejection fraction of approximately 55-60%. LVEDP 13 mm Hg.  Normal left coronary coronary circulation with a large LAD and circumflex vessel and diminutive ramus intermediate vessel.  Single vessel CAD with 30% followed by 80% mid RCA stenosis.  Successful PCI with PTCA/DES stenting of the mid RCA with ultimate insertion of a 2.7x 20 mm Synergy DES stent postdilated to 2.92 mm with the stenoses being reduced to 0%.  Postcath RECOMMENDATION: The patient will continue with dual antiplatelet and medical therapy. Aggressive  lipid intervention with a target LDL less than 70 will be undertaken. The patient will follow up with Dr. Carlyle Dolly.   07/2016 Exercise nuclear stress  Blood pressure demonstrated a hypertensive response to exercise.  There was no ST segment deviation noted during stress.  The study is normal. There are no perfusion defects consistent with prior infarct or current ishcemia.  This is a low risk study.  The left ventricular ejection fraction is hyperdynamic (>65%).   01/2017 PFTs Mild ventilatory defect without obstruction, moderately reduced TLC, high airway resistance.    Assessment and Plan  1. CAD - no recent symptoms, continue current meds - EKG today shows SR, no ischemic changes  2.Chronic diastolic HF - doing well, continue prn lasix  3. HTN  - elevated today, he reports recently normal at prior MD visit. He will call us at the end of the week to update [place] on his home bp's.   4. Hyperlipidemia -continue statin, request labs from pcp   F/u 1 year   Arnoldo Lenis, M.D.

## 2019-01-28 NOTE — Patient Instructions (Addendum)
Your physician wants you to follow-up in: [place] will receive a reminder letter in the mail two months in advance. If you don't receive a letter, please call our office to schedule the follow-up appointment.  Your physician recommends that you continue on your current medications as directed. Please refer to the Current Medication list given to you today.  PLEASE CALL us Friday WITH BLOOD PRESSURE   Thank you for choosing [facility]!!

## 2019-01-31 ENCOUNTER — Telehealth: Payer: Self-pay | Admitting: Cardiology

## 2019-01-31 NOTE — Telephone Encounter (Signed)
LM to return call.

## 2019-01-31 NOTE — Telephone Encounter (Signed)
Home bp's look good, no changes  J Elaynah [place] MD 

## 2019-01-31 NOTE — Telephone Encounter (Signed)
01-29-2019   1010AM   134/.80   9/07-2018     915AM    106/68   01/31/2019      1015AM    116/66   [phone]

## 2019-02-04 NOTE — Telephone Encounter (Signed)
Pt aware.

## 2019-02-05 ENCOUNTER — Encounter: Payer: Self-pay | Admitting: *Deleted

## 2019-06-20 ENCOUNTER — Other Ambulatory Visit: Payer: Self-pay | Admitting: Cardiology

## 2019-06-20 MED ORDER — LOSARTAN POTASSIUM 25 MG PO TABS: 25.0000 mg | ORAL_TABLET | Freq: Every day | ORAL | 3 refills | Status: DC

## 2019-06-20 NOTE — Telephone Encounter (Signed)
     1. Which medications need to be refilled? (please list name of each medication and dose if known)  losartan (COZAAR) 25 MG tablet    2. Which pharmacy/location (including street and city if local pharmacy) is medication to be sent to? [facility] [place]   3. Do they need a 30 day or 90 day supply?   Patient is out of medication

## 2019-10-16 ENCOUNTER — Institutional Professional Consult (permissible substitution): Payer: Medicare Other | Admitting: Internal Medicine

## 2019-11-21 ENCOUNTER — Ambulatory Visit (INDEPENDENT_AMBULATORY_CARE_PROVIDER_SITE_OTHER): Payer: Medicare Other | Admitting: Pulmonary Disease

## 2019-11-21 ENCOUNTER — Encounter: Payer: Self-pay | Admitting: Pulmonary Disease

## 2019-11-21 ENCOUNTER — Other Ambulatory Visit: Payer: Self-pay

## 2019-11-21 DIAGNOSIS — J45909 Unspecified asthma, uncomplicated: Secondary | ICD-10-CM | POA: Insufficient documentation

## 2019-11-21 DIAGNOSIS — J4530 Mild persistent asthma, uncomplicated: Secondary | ICD-10-CM | POA: Diagnosis not present

## 2019-11-21 DIAGNOSIS — G4733 Obstructive sleep apnea (adult) (pediatric): Secondary | ICD-10-CM

## 2019-11-21 NOTE — Assessment & Plan Note (Addendum)
He is compliant with his CPAP machine.  This is managed by the [place].  He will take in his machine on his next visit and request a replacement.  Weight loss encouraged, compliance with goal of at least 4-6 hrs every night is the expectation. Advised against medications with sedative side effects Cautioned against driving when sleepy - understanding that sleepiness will vary on a day to day basis

## 2019-11-21 NOTE — Progress Notes (Addendum)
Subjective:    Edwin Monroe ID: Edwin Monroe, male    DOB: Oct 02, 1945, 74 y.o.   MRN: [id number]  HPI  Chief Complaint  Edwin Monroe presents with  . Consult    Edwin Monroe has OSA and is on CPAP. Edwin Monroe is establish care feels good overall. Edwin Monroe has shortness of breath with exertion. Denies cough    Edwin Monroe presents to establish care for asthma and OSA.  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  Denies seasonal allergies, Dr. Luan Pulling asked Edwin Monroe to take Symbicort daily and Edwin Monroe is compliant, rarely uses albuterol.  Does not remember when Edwin Monroe had Edwin Monroe last exacerbation I reviewed chest x-ray and CT abdomen records from 03/2017 which state clear lung fields  OSA was diagnosed more than 10 years ago at the [place] and Edwin Monroe is still using the same CPAP machine Edwin Monroe settled down with a full facemask, reports compliance.  Bedtime is around 10 PM, sleep latency is minimal, Edwin Monroe sleeps on Edwin Monroe side with 2 pillows has a reclining bed, denies frequent nocturnal awakenings or nocturia and is out of bed by 8 AM feeling rested without dryness of mouth or headaches.  "I do not have a sleep problem".  Sleepiness score is 5  PMH -colon cancer, CAD status post stent to RCA 2017   Family history of lung cancer  FH of lung CA in Edwin Monroe brother who smokes  Significant tests/ events [facility])  12/2012 -214 lbs - inadequate titration bipap 14/10 tried, bipap download 2015 17/13 >> reisdual AHI 11/h, mainly obstructive  PFTs 01/2017 -ratio of 83, FEV1 84%, FVC 76%, TLC 60%, DLCO 66% but corrects to 97% for alveolar volume >> moderate extraparenchymal restriction consistent with obesity    Past Medical History:  Diagnosis Date  . Agent orange exposure [id number]  . Anxiety disorder   . Arthritis    "back, knees, shoulders" (01/06/2016)  . Asthma   . CAD (coronary artery disease)    a.  remote MI 52 with OK cath at that time. b. LHC 01/06/16:  PTCA/DES to mRCA, left coronary system OK, LVEF 55-65%.   . Cancer, colon ([place])   . Cardiomyopathy ([place])    a. EF 45-50% in 2015. b. LVEF 55-65% in 2017.  Marland Kitchen Chronic lower back pain   . GERD (gastroesophageal reflux disease)   . Hypertension   . Hypothyroidism    "got nuclear treatment in the 70's"  . Myocardial infarction ([place]) 04/1990    in [place] at [place]/notes 08/21/2013  . OSA on CPAP   . Prediabetes      Past Surgical History:  Procedure Laterality Date  . APPENDECTOMY  1948  . BACK SURGERY    . CARDIAC CATHETERIZATION  05/1990  . CARDIAC CATHETERIZATION N/A 01/06/2016   Procedure: Left Heart Cath and Coronary Angiography;  Surgeon: Troy Sine, MD;  Location: [facility];  Service: Cardiovascular;  Laterality: N/A;  . CARDIAC CATHETERIZATION N/A 01/06/2016   Procedure: Coronary Stent Intervention;  Surgeon: Troy Sine, MD;  Location: [facility];  Service: Cardiovascular;  Laterality: N/A;  RCA mid  . CATARACT EXTRACTION W/ INTRAOCULAR LENS  IMPLANT, BILATERAL Bilateral   . POSTERIOR LUMBAR FUSION  08/1982; 05/1996    Allergies  Allergen Reactions  . Prednisone Other (See Comments)    Tablet form only per Edwin Monroe (paranoid).  Injection  form is fine.     Social History   Socioeconomic History  . Marital status: Married    Spouse name: Not on file  . Number of children: Not on file  . Years of education: Not on file  . Highest education level: Not on file  Occupational History  . Not on file  Tobacco Use  . Smoking status: Former Smoker    Years: 35.00    Types: Cigars    Quit date: 05/29/2002    Years since quitting: 17.4  . Smokeless tobacco: Never Used  . Tobacco comment: NEVER SMOKED CIGARETTES  Vaping Use  . Vaping Use: Never used  Substance and Sexual Activity  . Alcohol use: Yes    Alcohol/week: 0.0 standard drinks    Comment: 01/06/2016 "stopped drinking in 2004"  . Drug  use: No  . Sexual activity: Not Currently  Other Topics Concern  . Not on file  Social History Narrative  . Not on file   Social Determinants of Health   Financial Resource Strain:   . Difficulty of Paying Living Expenses:   Food Insecurity:   . Worried About Charity fundraiser in the Last Year:   . Arboriculturist in the Last Year:   Transportation Needs:   . Film/video editor (Medical):   Marland Kitchen Lack of Transportation (Non-Medical):   Physical Activity:   . Days of Exercise per Week:   . Minutes of Exercise per Session:   Stress:   . Feeling of Stress :   Social Connections:   . Frequency of Communication with Friends and Family:   . Frequency of Social Gatherings with Friends and Family:   . Attends Religious Services:   . Active Member of Clubs or Organizations:   . Attends Archivist Meetings:   Marland Kitchen Marital Status:   Intimate Partner Violence:   . Fear of Current or Ex-Partner:   . Emotionally Abused:   Marland Kitchen Physically Abused:   . Sexually Abused:      Family History  Problem Relation Age of Onset  . Heart attack Father        MID 70'S  . Cancer Father   . Diabetes Mother   . Stroke Mother   . Diabetes Brother        X'S 2     Review of Systems Constitutional: negative for anorexia, fevers and sweats  Eyes: negative for irritation, redness and visual disturbance  Ears, nose, mouth, throat, and face: negative for earaches, epistaxis, nasal congestion and sore throat  Respiratory: negative for cough,  sputum and wheezing  Cardiovascular: negative for chest pain,  lower extremity edema, orthopnea, palpitations and syncope  Gastrointestinal: negative for abdominal pain, constipation, diarrhea, melena, nausea and vomiting  Genitourinary:negative for dysuria, frequency and hematuria  Hematologic/lymphatic: negative for bleeding, easy bruising and lymphadenopathy  Musculoskeletal:negative for arthralgias, muscle weakness and stiff joints  Neurological:  negative for coordination problems, gait problems, headaches and weakness  Endocrine: negative for diabetic symptoms including polydipsia, polyuria and weight loss     Objective:   Physical Exam  Gen. Pleasant, obese, in no distress, normal affect ENT - no pallor,icterus, no post nasal drip, class 2-3 airway Neck: No JVD, no thyromegaly, no carotid bruits Lungs: no use of accessory muscles, no dullness to percussion, decreased without rales or rhonchi  Cardiovascular: Rhythm regular, heart sounds  normal, no murmurs or gallops, no peripheral edema Abdomen: soft and non-tender, no hepatosplenomegaly, BS normal. Musculoskeletal: No deformities,  no cyanosis or clubbing Neuro:  alert, non focal, no tremors       Assessment & Plan:

## 2019-11-21 NOTE — Patient Instructions (Signed)
  Sleep study from the [place] will be obtained Continue on Symbicort. Chest x-ray for shortness of breath

## 2019-11-21 NOTE — Assessment & Plan Note (Signed)
He carries a diagnosis of asthma mild persistent and has been maintained on Symbicort. PFTs did not show reversibility Show mild restriction from 2018.  There is no evidence of ILD on exam or previous imaging in 2018 but we will obtain a chest x-ray to clarify given shortness of breath.  Continue Symbicort for now.  He appears to be well controlled without any use of albuterol

## 2020-02-09 ENCOUNTER — Ambulatory Visit: Payer: Medicare Other | Admitting: Cardiology

## 2020-02-25 ENCOUNTER — Encounter: Payer: Self-pay | Admitting: Cardiology

## 2020-02-25 ENCOUNTER — Encounter: Payer: Self-pay | Admitting: *Deleted

## 2020-02-25 ENCOUNTER — Ambulatory Visit (INDEPENDENT_AMBULATORY_CARE_PROVIDER_SITE_OTHER): Payer: Medicare Other | Admitting: Cardiology

## 2020-02-25 VITALS — BP 148/84 | HR 66 | Ht 68.0 in | Wt 225.4 lb

## 2020-02-25 DIAGNOSIS — R0602 Shortness of breath: Secondary | ICD-10-CM | POA: Diagnosis not present

## 2020-02-25 DIAGNOSIS — E782 Mixed hyperlipidemia: Secondary | ICD-10-CM

## 2020-02-25 DIAGNOSIS — I10 Essential (primary) hypertension: Secondary | ICD-10-CM

## 2020-02-25 DIAGNOSIS — I5032 Chronic diastolic (congestive) heart failure: Secondary | ICD-10-CM | POA: Diagnosis not present

## 2020-02-25 DIAGNOSIS — I2510 Atherosclerotic heart disease of native coronary artery without angina pectoris: Secondary | ICD-10-CM | POA: Diagnosis not present

## 2020-02-25 MED ORDER — NISOLDIPINE ER 34 MG PO TB24: 34.0000 mg | ORAL_TABLET | Freq: Every day | ORAL | 6 refills | Status: AC

## 2020-02-25 NOTE — Progress Notes (Signed)
Clinical Summary Mr. Delellis is a 74 y.o.male seen today for follow up of the following medical problems.   1. CAD - notes indicate remote history of MI in early 41s in [place] at [place]. Transferred to [facility] at that time,cath at that time showed patent coronaries - Received DES to RCA 12/2015. LVEF by LVgram at that time 55-65% - 07/2016 exercise nuclear stress: no ischemia     - no recent chest pain. Some recent SOB/DOE x 6 months. DOE at 1-2 blocks - no recent edema. Some wheezing, better with wheezing. Uses symbicort. No cough.  - compliant with meds   2. HTN  -compliant with meds - takes ibuprofen bid.   - home bp's 140s/80s   3. OSA  - [facility]   4. Hyperlipidemia - 07/2016 TC 91 TG 117 HDL 38 LDL 30 - he reports recent labs with pcp  - he is compliant with statin  5. DM2 - followed by pcp  6. SOB - abnormal PFTs asdescribed below.  - followed by pulmonary  7. Colon cancer - prior surgery in [place], [place]   8. AAA screen  - Jan 2015 CT abd no AAA reported    Past Medical History:  Diagnosis Date  . Agent orange exposure [id number]  . Anxiety disorder   . Arthritis    "back, knees, shoulders" (01/06/2016)  . Asthma   . CAD (coronary artery disease)    a. remote MI 59 with OK cath at that time. b. LHC 01/06/16:  PTCA/DES to mRCA, left coronary system OK, LVEF 55-65%.   . Cancer, colon ([place])   . Cardiomyopathy ([place])    a. EF 45-50% in 2015. b. LVEF 55-65% in 2017.  Marland Kitchen Chronic lower back pain   . GERD (gastroesophageal reflux disease)   . Hypertension   . Hypothyroidism    "got nuclear treatment in the 70's"  . Myocardial infarction ([place]) 04/1990    in [place] at [place]/notes 08/21/2013  . OSA on CPAP   . Prediabetes      Allergies  Allergen Reactions  . Prednisone Other (See Comments)    Tablet form only per patient (paranoid).  Injection form is fine.      Current  Outpatient Medications  Medication Sig Dispense Refill  . albuterol (VENTOLIN HFA) 108 (90 Base) MCG/ACT inhaler Inhale 2 puffs into the lungs as needed.    Marland Kitchen Apoaequorin (PREVAGEN) 10 MG CAPS Take 1 tablet by mouth daily.    . Ascorbic Acid (VITAMIN C) 1000 MG tablet Take 1,000 mg by mouth daily.    Marland Kitchen aspirin EC 81 MG tablet Take 81 mg by mouth daily.    Marland Kitchen atorvastatin (LIPITOR) 80 MG tablet TAKE 1 TABLET BY MOUTH EVERY EVENING 30 tablet 6  . betamethasone dipropionate 0.05 % cream Apply 1 application topically daily.    . Biotin 1000 MCG tablet Take 1,000 mcg by mouth daily.    . budesonide-formoterol (SYMBICORT) 160-4.5 MCG/ACT inhaler Inhale 2 puffs into the lungs 2 (two) times daily.    . Calcium Carbonate-Vitamin D (CALTRATE 600+D PO) Take 1 tablet by mouth daily.     . carvedilol (COREG) 25 MG tablet Take 1 tablet (25 mg total) by mouth 2 (two) times daily. 180 tablet 3  . Cholecalciferol (VITAMIN D3) 2000 units TABS Take 1 tablet by mouth daily.    . cyclobenzaprine (FLEXERIL) 10 MG tablet Take 10 mg by mouth at bedtime.    Marland Kitchen  diclofenac Sodium (VOLTAREN) 1 % GEL Apply 4 g topically 4 (four) times daily.    . ferrous sulfate 325 (65 FE) MG tablet Take 325 mg by mouth daily with breakfast.    . folic acid (FOLVITE) 1 MG tablet Take 1 mg by mouth daily.    . furosemide (LASIX) 20 MG tablet TAKE 40 MG FOR 3 DAYS THEN RESUME 20 MG DAILY MAY TAKE ADDITIONAL 20 MG AS NEEDED 96 tablet 1  . gabapentin (NEURONTIN) 300 MG capsule Take 1 capsule by mouth at bedtime.  2  . glipiZIDE (GLUCOTROL) 5 MG tablet Take by mouth daily before breakfast.    . HYDROcodone-acetaminophen (NORCO) 7.5-325 MG per tablet Take 1 tablet by mouth 2 (two) times daily as needed for moderate pain.    Marland Kitchen ibuprofen (ADVIL,MOTRIN) 800 MG tablet Take 800 mg by mouth 2 (two) times daily as needed.    Marland Kitchen losartan (COZAAR) 25 MG tablet Take 1 tablet (25 mg total) by mouth daily. 90 tablet 3  . magnesium gluconate (MAGONATE) 500 MG  tablet Take 500 mg by mouth daily.    . Multiple Vitamin (MULTIVITAMIN) tablet Take 1 tablet by mouth daily.    . multivitamin-iron-minerals-folic acid (CENTRUM) chewable tablet Chew 1 tablet by mouth daily.    . nisoldipine (SULAR) 17 MG 24 hr tablet Take 17 mg by mouth daily.    . nitroGLYCERIN (NITROSTAT) 0.4 MG SL tablet Place 1 tablet (0.4 mg total) under the tongue every 5 (five) minutes as needed for chest pain. 25 tablet 3  . omeprazole (PRILOSEC) 20 MG capsule Take 20 mg by mouth daily.    . sildenafil (VIAGRA) 100 MG tablet Take 100 mg by mouth daily as needed for erectile dysfunction.    Marland Kitchen terazosin (HYTRIN) 1 MG capsule Take 1 mg by mouth at bedtime.    . Turmeric (QC TUMERIC COMPLEX) 500 MG CAPS Take 1 tablet by mouth daily.    . vitamin B-12 (CYANOCOBALAMIN) 1000 MCG tablet Take 1,000 mcg by mouth daily.     No current facility-administered medications for this visit.     Past Surgical History:  Procedure Laterality Date  . APPENDECTOMY  1948  . BACK SURGERY    . CARDIAC CATHETERIZATION  05/1990  . CARDIAC CATHETERIZATION N/A 01/06/2016   Procedure: Left Heart Cath and Coronary Angiography;  Surgeon: Troy Sine, MD;  Location: [facility];  Service: Cardiovascular;  Laterality: N/A;  . CARDIAC CATHETERIZATION N/A 01/06/2016   Procedure: Coronary Stent Intervention;  Surgeon: Troy Sine, MD;  Location: [facility];  Service: Cardiovascular;  Laterality: N/A;  RCA mid  . CATARACT EXTRACTION W/ INTRAOCULAR LENS  IMPLANT, BILATERAL Bilateral   . POSTERIOR LUMBAR FUSION  08/1982; 05/1996     Allergies  Allergen Reactions  . Prednisone Other (See Comments)    Tablet form only per patient (paranoid).  Injection form is fine.       Family History  Problem Relation Age of Onset  . Heart attack Father        MID 70'S  . Cancer Father   . Diabetes Mother   . Stroke Mother   . Diabetes Brother        X'S 2     Social History Mr. Jewel reports  that he quit smoking about 17 years ago. His smoking use included cigars. He quit after 35.00 years of use. He has never used smokeless tobacco. Mr. Conwell reports current alcohol use.  Review of Systems CONSTITUTIONAL: No weight loss, fever, chills, weakness or fatigue.  HEENT: Eyes: No visual loss, blurred vision, double vision or yellow sclerae.No hearing loss, sneezing, congestion, runny nose or sore throat.  SKIN: No rash or itching.  CARDIOVASCULAR: per hpi RESPIRATORY: No shortness of breath, cough or sputum.  GASTROINTESTINAL: No anorexia, nausea, vomiting or diarrhea. No abdominal pain or blood.  GENITOURINARY: No burning on urination, no polyuria NEUROLOGICAL: No headache, dizziness, syncope, paralysis, ataxia, numbness or tingling in the extremities. No change in bowel or bladder control.  MUSCULOSKELETAL: No muscle, back pain, joint pain or stiffness.  LYMPHATICS: No enlarged nodes. No history of splenectomy.  PSYCHIATRIC: No history of depression or anxiety.  ENDOCRINOLOGIC: No reports of sweating, cold or heat intolerance. No polyuria or polydipsia.  Marland Kitchen   Physical Examination Today's Vitals   02/25/20 1035  BP: (!) 148/84  Pulse: 66  SpO2: 95%  Weight: 225 lb 6.4 oz (102.2 kg)  Height: 5\' 8"  (1.727 m)   Body mass index is 34.27 kg/m.  Gen: resting comfortably, no acute distress HEENT: no scleral icterus, pupils equal round and reactive, no palptable cervical adenopathy,  CV: RRR, no mrg, no jvd Resp: Clear to auscultation bilaterally GI: abdomen is soft, non-tender, non-distended, normal bowel sounds, no hepatosplenomegaly MSK: extremities are warm, no edema.  Skin: warm, no rash Neuro:  no focal deficits Psych: appropriate affect   Diagnostic Studies [facility] Cath 05/30/1990 DIAGNOSTIC SUMMARY Coronary Artery Disease RCA system: normal Left Main: normal No significant CAD indicated LAD system: normal Left Ventriculogram LCX system: normal Ejection  Fraction: 71%  COMMENT The LAD curls the Apex. The RCA and Cirx arteries both contribute small PDA's.   07/2013 Echo LVEF 45-50%, mild LVH, grade I diastolic dysfunction, hypokinesis of the apicalanterior and apical latera walls.    12/2015 cath  Mid RCA-1 lesion, 30 %stenosed.  A STENT SYNERGY DES 2.75X20 drug eluting stent was successfully placed.  Mid RCA-2 lesion, 80 %stenosed.  Post intervention, there is a 0% residual stenosis.  The left ventricular systolic function is normal.  LV end diastolic pressure is normal.  The left ventricular ejection fraction is 55-65% by visual estimate.  There is no mitral valve regurgitation.  Normal LV function with an ejection fraction of approximately 55-60%. LVEDP 13 mm Hg.  Normal left coronary coronary circulation with a large LAD and circumflex vessel and diminutive ramus intermediate vessel.  Single vessel CAD with 30% followed by 80% mid RCA stenosis.  Successful PCI with PTCA/DES stenting of the mid RCA with ultimate insertion of a 2.7x 20 mm Synergy DES stent postdilated to 2.92 mm with the stenoses being reduced to 0%.  Postcath RECOMMENDATION: The patient will continue with dual antiplatelet and medical therapy. Aggressive lipid intervention with a target LDL less than 70 will be undertaken. The patient will follow up with Dr. Carlyle Dolly.   07/2016 Exercise nuclear stress  Blood pressure demonstrated a hypertensive response to exercise.  There was no ST segment deviation noted during stress.  The study is normal. There are no perfusion defects consistent with prior infarct or current ishcemia.  This is a low risk study.  The left ventricular ejection fraction is hyperdynamic (>65%).   01/2017 PFTs Mild ventilatory defect without obstruction, moderately reduced TLC, high airway resistance.    Assessment and Plan  1. CAD - no symptoms, continue current meds  2.Chronic diastolic HF -  no symptoms, continue current diuretic.   3. HTN  - above goal,  increase nisoldopine.   4. Hyperlipidemia -request pcp labs, continue statin     Arnoldo Lenis, M.D.

## 2020-02-25 NOTE — Patient Instructions (Signed)
Medication Instructions:   Increase Nisoldipine to 34mg  daily.  Continue all other medications.    Labwork: none  Testing/Procedures:  Your physician has requested that you have an echocardiogram. Echocardiography is a painless test that uses sound waves to create images of your heart. It provides your doctor with information about the size and shape of your heart and how well your heart's chambers and valves are working. This procedure takes approximately one hour. There are no restrictions for this procedure.  Office will contact with results via phone or letter.    Follow-Up: 2 months   Any Other Special Instructions Will Be Listed Below (If Applicable).  If you need a refill on your cardiac medications before your next appointment, please call your pharmacy.

## 2020-03-04 ENCOUNTER — Ambulatory Visit (INDEPENDENT_AMBULATORY_CARE_PROVIDER_SITE_OTHER): Payer: Medicare Other

## 2020-03-04 DIAGNOSIS — R0602 Shortness of breath: Secondary | ICD-10-CM | POA: Diagnosis not present

## 2020-03-04 LAB — ECHOCARDIOGRAM COMPLETE
Area-P 1/2: 2.96 cm2
Calc EF: 56.3 %
MV M vel: 2.46 m/s
MV Peak grad: 24.2 mmHg
S' Lateral: 2.98 cm
Single Plane A2C EF: 60.4 %
Single Plane A4C EF: 55 %

## 2020-03-11 ENCOUNTER — Telehealth: Payer: Self-pay | Admitting: Cardiology

## 2020-03-11 NOTE — Telephone Encounter (Signed)
Patient called requesting recent test results. 

## 2020-03-11 NOTE — Telephone Encounter (Signed)
Returned call to patient, got answering machine.Left message to return call.

## 2020-03-12 NOTE — Telephone Encounter (Signed)
New message     Patient called to get his results ,

## 2020-04-25 NOTE — Progress Notes (Signed)
Cardiology Office Note  Date: 04/26/2020   ID: Edwin Monroe, DOB 09/15/1945, MRN [id number]  PCP:  Joseph Art, MD  Cardiologist:  Carlyle Dolly, MD Electrophysiologist:  None   Chief Complaint: Follow-up CAD, shortness of breath, chronic diastolic heart failure, HTN, HLD, OSA on CPAP.  History of Present Illness: Edwin Monroe is a 74 y.o. male with a history of CAD, shortness of breath, chronic diastolic heart failure, HTN, HLD, OSA on CPAP, colon cancer.  History of MI early 13s in [place] in [place].  Cardiac catheterization at [facility] showed normal coronaries.  2017 DES to RCA.  LVEF by LV gram 55 to 65%.  Exercise nuclear stress test 2018 no ischemia.  Compliant with medications Home blood pressures were 140s over 80s.  Poor compliance with CPAP.  He was compliant with statin medication.  Followed by PCP for DM 2.  Shortness of breath with history of abnormal PFTs.  Followed by pulmonary.  He is here for 59-month follow-up.  We reviewed the results of his recent echocardiogram.  Blood pressures elevated on arrival at 148/82.  Patient states he recently had his nisoldipine increased to 34 mg by primary care provider.  He denies any anginal or exertional symptoms, palpitations or arrhythmias, orthostatic symptoms, stroke/TIA-like symptoms, bleeding issues, PND, orthopnea, claudication-like symptoms, DVT or PE-like symptoms, or lower extremity edema.  Not compliant with CPAP.  States when he tries to wear the mask he usually finds it laying on the floor after he wakes up.  He is following with pulmonary.  Past Medical History:  Diagnosis Date  . Agent orange exposure [id number]  . Anxiety disorder   . Arthritis    "back, knees, shoulders" (01/06/2016)  . Asthma   . CAD (coronary artery disease)    a. remote MI 22 with OK cath at that time. b. LHC 01/06/16:  PTCA/DES to mRCA, left coronary system OK, LVEF 55-65%.   . Cancer, colon ([place])   . Cardiomyopathy ([place])     a. EF 45-50% in 2015. b. LVEF 55-65% in 2017.  Marland Kitchen Chronic lower back pain   . GERD (gastroesophageal reflux disease)   . Hypertension   . Hypothyroidism    "got nuclear treatment in the 70's"  . Myocardial infarction ([place]) 04/1990    in [place] at [place]/notes 08/21/2013  . OSA on CPAP   . Prediabetes     Past Surgical History:  Procedure Laterality Date  . APPENDECTOMY  1948  . BACK SURGERY    . CARDIAC CATHETERIZATION  05/1990  . CARDIAC CATHETERIZATION N/A 01/06/2016   Procedure: Left Heart Cath and Coronary Angiography;  Surgeon: Troy Sine, MD;  Location: [place] [facility];  Service: Cardiovascular;  Laterality: N/A;  . CARDIAC CATHETERIZATION N/A 01/06/2016   Procedure: Coronary Stent Intervention;  Surgeon: Troy Sine, MD;  Location: [place] [facility];  Service: Cardiovascular;  Laterality: N/A;  RCA mid  . CATARACT EXTRACTION W/ INTRAOCULAR LENS  IMPLANT, BILATERAL Bilateral   . POSTERIOR LUMBAR FUSION  08/1982; 05/1996    Current Outpatient Medications  Medication Sig Dispense Refill  . albuterol (VENTOLIN HFA) 108 (90 Base) MCG/ACT inhaler Inhale 2 puffs into the lungs as needed.    Marland Kitchen Apoaequorin (PREVAGEN) 10 MG CAPS Take 1 tablet by mouth daily.    . Ascorbic Acid (VITAMIN C) 1000 MG tablet Take 1,000 mg by mouth daily.    Marland Kitchen aspirin EC 81 MG tablet Take 81 mg by mouth daily.    Marland Kitchen  atorvastatin (LIPITOR) 80 MG tablet TAKE 1 TABLET BY MOUTH EVERY EVENING 30 tablet 6  . betamethasone dipropionate 0.05 % cream Apply 1 application topically daily.    . Biotin 1000 MCG tablet Take 1,000 mcg by mouth daily.    . budesonide-formoterol (SYMBICORT) 160-4.5 MCG/ACT inhaler Inhale 2 puffs into the lungs 2 (two) times daily.    Marland Kitchen BYDUREON BCISE 2 MG/0.85ML AUIJ Inject into the skin once a week.    . Calcium Carbonate-Vitamin D (CALTRATE 600+D PO) Take 1 tablet by mouth daily.     . carvedilol (COREG) 25 MG tablet Take 1 tablet (25 mg total) by mouth 2 (two) times daily. 180  tablet 3  . Cholecalciferol (VITAMIN D3) 2000 units TABS Take 1 tablet by mouth daily.    . cyclobenzaprine (FLEXERIL) 10 MG tablet Take 10 mg by mouth at bedtime.    . diclofenac Sodium (VOLTAREN) 1 % GEL Apply 4 g topically 4 (four) times daily.    . ferrous sulfate 325 (65 FE) MG tablet Take 325 mg by mouth daily with breakfast.    . folic acid (FOLVITE) 1 MG tablet Take 1 mg by mouth daily.    . furosemide (LASIX) 20 MG tablet TAKE 40 MG FOR 3 DAYS THEN RESUME 20 MG DAILY MAY TAKE ADDITIONAL 20 MG AS NEEDED 96 tablet 1  . gabapentin (NEURONTIN) 300 MG capsule Take 1 capsule by mouth at bedtime.  2  . HYDROcodone-acetaminophen (NORCO) 7.5-325 MG per tablet Take 1 tablet by mouth 2 (two) times daily as needed for moderate pain.    Marland Kitchen ibuprofen (ADVIL,MOTRIN) 800 MG tablet Take 800 mg by mouth 2 (two) times daily as needed.    Marland Kitchen losartan (COZAAR) 25 MG tablet Take 1 tablet (25 mg total) by mouth daily. 90 tablet 3  . magnesium gluconate (MAGONATE) 500 MG tablet Take 500 mg by mouth daily.    . Multiple Vitamin (MULTIVITAMIN) tablet Take 1 tablet by mouth daily.    . multivitamin-iron-minerals-folic acid (CENTRUM) chewable tablet Chew 1 tablet by mouth daily.    . nisoldipine (SULAR) 34 MG 24 hr tablet Take 1 tablet (34 mg total) by mouth daily. 30 tablet 6  . nitroGLYCERIN (NITROSTAT) 0.4 MG SL tablet Place 1 tablet (0.4 mg total) under the tongue every 5 (five) minutes as needed for chest pain. 25 tablet 3  . omeprazole (PRILOSEC) 20 MG capsule Take 20 mg by mouth daily.    . sildenafil (VIAGRA) 100 MG tablet Take 100 mg by mouth daily as needed for erectile dysfunction.    Marland Kitchen terazosin (HYTRIN) 1 MG capsule Take 1 mg by mouth at bedtime.    . Turmeric (QC TUMERIC COMPLEX) 500 MG CAPS Take 1 tablet by mouth daily.    . vitamin B-12 (CYANOCOBALAMIN) 1000 MCG tablet Take 1,000 mcg by mouth daily.     No current facility-administered medications for this visit.   Allergies:  Prednisone    Social History: The patient  reports that he quit smoking about 17 years ago. His smoking use included cigars. He quit after 35.00 years of use. He has never used smokeless tobacco. He reports current alcohol use. He reports that he does not use drugs.   Family History: The patient's family history includes Cancer in his father; Diabetes in his brother and mother; Heart attack in his father; Stroke in his mother.   ROS:  Please see the history of present illness. Otherwise, complete review of systems is positive for none.  All other systems are reviewed and negative.   Physical Exam: VS:  BP (!) 148/82   Pulse 69   Ht 5\' 8"  (1.727 m)   Wt 223 lb 3.2 oz (101.2 kg)   SpO2 96%   BMI 33.94 kg/m , BMI Body mass index is 33.94 kg/m.  Wt Readings from Last 3 Encounters:  04/26/20 223 lb 3.2 oz (101.2 kg)  02/25/20 225 lb 6.4 oz (102.2 kg)  11/21/19 223 lb (101.2 kg)    General: Patient appears comfortable at rest. HEENT: Conjunctiva and lids normal, oropharynx clear with moist mucosa. Neck: Supple, no elevated JVP or carotid bruits, no thyromegaly. Lungs: Clear to auscultation, nonlabored breathing at rest. Cardiac: Regular rate and rhythm, no S3 or significant systolic murmur, no pericardial rub. Abdomen: Soft, nontender, no hepatomegaly, bowel sounds present, no guarding or rebound. Extremities: No pitting edema, distal pulses 2+. Skin: Warm and dry. Musculoskeletal: No kyphosis. Neuropsychiatric: Alert and oriented x3, affect grossly appropriate.  ECG:  EKG 02/25/2020 showed normal sinus rhythm rate of 66.  Recent Labwork: No results found for requested labs within last 8760 hours.  No results found for: CHOL, TRIG, HDL, CHOLHDL, VLDL, LDLCALC, LDLDIRECT  Other Studies Reviewed Today:  Echocardiogram 03/04/2020  1. Left ventricular ejection fraction, by estimation, is 55 to 60%. The left ventricle has normal function. The left ventricle has no regional wall motion  abnormalities. Left ventricular diastolic parameters are indeterminate. 2. Right ventricular systolic function is normal. The right ventricular size is normal. Tricuspid regurgitation signal is inadequate for assessing PA pressure. 3. The mitral valve is grossly normal. Trivial mitral valve regurgitation. 4. The aortic valve is tricuspid. Aortic valve regurgitation is not visualized. Mild aortic valve sclerosis is present, with no evidence of aortic valve stenosis. 5. The inferior vena cava is normal in size with greater than 50% respiratory variability, suggesting right atrial pressure of 3 mmHg.    [facility] Cath 05/30/1990 DIAGNOSTIC SUMMARY Coronary Artery Disease RCA system: normal Left Main: normal No significant CAD indicated LAD system: normal Left Ventriculogram LCX system: normal Ejection Fraction: 71%  COMMENT The LAD curls the Apex. The RCA and Cirx arteries both contribute small PDA's.   07/2013 Echo LVEF 45-50%, mild LVH, grade I diastolic dysfunction, hypokinesis of the apicalanterior and apical latera walls.    12/2015 cath  Mid RCA-1 lesion, 30 %stenosed.  A STENT SYNERGY DES 2.75X20 drug eluting stent was successfully placed.  Mid RCA-2 lesion, 80 %stenosed.  Post intervention, there is a 0% residual stenosis.  The left ventricular systolic function is normal.  LV end diastolic pressure is normal.  The left ventricular ejection fraction is 55-65% by visual estimate.  There is no mitral valve regurgitation.  Normal LV function with an ejection fraction of approximately 55-60%. LVEDP 13 mm Hg.  Normal left coronary coronary circulation with a large LAD and circumflex vessel and diminutive ramus intermediate vessel.  Single vessel CAD with 30% followed by 80% mid RCA stenosis.  Successful PCI with PTCA/DES stenting of the mid RCA with ultimate insertion of a 2.7x 20 mm Synergy DES stent postdilated to 2.92 mm with the stenoses being reduced to  0%.  Postcath RECOMMENDATION: The patient will continue with dual antiplatelet and medical therapy. Aggressive lipid intervention with a target LDL less than 70 will be undertaken. The patient will follow up with Dr. Carlyle Dolly.   07/2016 Exercise nuclear stress  Blood pressure demonstrated a hypertensive response to exercise.  There was no ST segment deviation  noted during stress.  The study is normal. There are no perfusion defects consistent with prior infarct or current ishcemia.  This is a low risk study.  The left ventricular ejection fraction is hyperdynamic (>65%).   01/2017 PFTs Mild ventilatory defect without obstruction, moderately reduced TLC, high airway resistance.    Assessment and Plan:  1. CAD in native artery   2. Chronic diastolic heart failure ([place])   3. Essential hypertension   4. Mixed hyperlipidemia    1. CAD in native artery No anginal or exertional symptoms.  Continue aspirin 81 mg daily.,  Carvedilol 25 mg p.o. twice daily.  Nitroglycerin sublingual as needed  2. Chronic diastolic heart failure ([facility]) Recent echocardiogramLVEF 55 to 60%.  Trivial MR LV diastolic parameters were indeterminate.  Continue 20 mg daily.  May take an additional 20 mg as needed.  3. Essential hypertension Blood pressure elevated today on arrival at 148/82.  Patient states his PCP recently increased his nisoldipine to 34 mg daily.  Continue nisoldipine at 34 mg daily.  Continue losartan 25 mg daily.  Continue carvedilol 25 mg p.o. twice daily.  4. Mixed hyperlipidemia Continue atorvastatin 80 mg p.o. daily.  Medication Adjustments/Labs and Tests Ordered: Current medicines are reviewed at length with the patient today.  Concerns regarding medicines are outlined above.   Disposition: Follow-up with Dr. Harl Bowie or APP 6 months.  Signed, Levell July, NP 04/26/2020 10:29 AM    [facility] [place], [place], [place]  [address] Phone: [phone]; Fax: [phone]

## 2020-04-26 ENCOUNTER — Other Ambulatory Visit: Payer: Self-pay

## 2020-04-26 ENCOUNTER — Other Ambulatory Visit: Payer: Self-pay | Admitting: Cardiology

## 2020-04-26 ENCOUNTER — Encounter: Payer: Self-pay | Admitting: Family Medicine

## 2020-04-26 ENCOUNTER — Ambulatory Visit (INDEPENDENT_AMBULATORY_CARE_PROVIDER_SITE_OTHER): Payer: Medicare Other | Admitting: Family Medicine

## 2020-04-26 VITALS — BP 148/82 | HR 69 | Ht 68.0 in | Wt 223.2 lb

## 2020-04-26 DIAGNOSIS — E782 Mixed hyperlipidemia: Secondary | ICD-10-CM

## 2020-04-26 DIAGNOSIS — I2510 Atherosclerotic heart disease of native coronary artery without angina pectoris: Secondary | ICD-10-CM

## 2020-04-26 DIAGNOSIS — I5032 Chronic diastolic (congestive) heart failure: Secondary | ICD-10-CM | POA: Diagnosis not present

## 2020-04-26 DIAGNOSIS — I10 Essential (primary) hypertension: Secondary | ICD-10-CM

## 2020-04-26 NOTE — Patient Instructions (Signed)
Medication Instructions:  Continue all current medications.   Labwork: none  Testing/Procedures: none  Follow-Up: 6 months   Any Other Special Instructions Will Be Listed Below (If Applicable).   If you need a refill on your cardiac medications before your next appointment, please call your pharmacy.  

## 2020-07-30 ENCOUNTER — Other Ambulatory Visit: Payer: Self-pay | Admitting: Cardiology

## 2020-08-06 ENCOUNTER — Other Ambulatory Visit: Payer: Self-pay | Admitting: Cardiology

## 2020-08-09 ENCOUNTER — Other Ambulatory Visit: Payer: Self-pay | Admitting: Cardiology

## 2020-10-08 ENCOUNTER — Other Ambulatory Visit: Payer: Self-pay | Admitting: Cardiology

## 2020-11-12 ENCOUNTER — Ambulatory Visit: Payer: Medicare Other | Admitting: Cardiology

## 2020-11-25 ENCOUNTER — Encounter: Payer: Self-pay | Admitting: Cardiology

## 2020-11-25 ENCOUNTER — Ambulatory Visit (INDEPENDENT_AMBULATORY_CARE_PROVIDER_SITE_OTHER): Payer: Medicare Other | Admitting: Cardiology

## 2020-11-25 VITALS — BP 148/90 | HR 69 | Ht 68.0 in | Wt 216.4 lb

## 2020-11-25 DIAGNOSIS — I10 Essential (primary) hypertension: Secondary | ICD-10-CM

## 2020-11-25 DIAGNOSIS — I2510 Atherosclerotic heart disease of native coronary artery without angina pectoris: Secondary | ICD-10-CM | POA: Diagnosis not present

## 2020-11-25 DIAGNOSIS — I5032 Chronic diastolic (congestive) heart failure: Secondary | ICD-10-CM

## 2020-11-25 NOTE — Progress Notes (Signed)
Clinical Summary Edwin Monroe is a 75 y.o.male seen today for follow up of the following medical problems.   1. CAD - notes indicate remote history of MI in early 87s in [place] at [place]. Transferred to [facility] at that time,cath at that time showed patent coronaries  -  Received DES to RCA 12/2015. LVEF by LVgram at that time 55-65% - 07/2016 exercise nuclear stress: no ischemia       - no recent chest pain. Some SOB at times with wheezing. No recent edema - compliant with meds     2. HTN   -compliant with meds - home bp's 130s/50s. Recent clinic visit 135/75     3. OSA   - [facility]     4. Hyperlipidemia  - 07/2016 TC 91 TG 117 HDL 38 LDL 30 - his labs are followed by pcp   5. DM2 - followed by pcp   6. SOB - abnormal PFTs as described below.  - followed by pulmonary   7. Colon cancer - prior surgery in [place], [place]     8. AAA screen - Jan 2015 CT abd no AAA reported   Past Medical History:  Diagnosis Date   Agent orange exposure [phone]   Anxiety disorder    Arthritis    "back, knees, shoulders" (01/06/2016)   Asthma    CAD (coronary artery disease)    a. remote MI 1991 with OK cath at that time. b. LHC 01/06/16:  PTCA/DES to mRCA, left coronary system OK, LVEF 55-65%.    Cancer, colon ([place])    Cardiomyopathy ([place])    a. EF 45-50% in 2015. b. LVEF 55-65% in 2017.   Chronic lower back pain    GERD (gastroesophageal reflux disease)    Hypertension    Hypothyroidism    "got nuclear treatment in the 70's"   Myocardial infarction ([place]) 04/1990    in [place] at [place]/notes 08/21/2013   OSA on CPAP    Prediabetes      Allergies  Allergen Reactions   Prednisone Other (See Comments)    Tablet form only per patient (paranoid).  Injection form is fine.      Current Outpatient Medications  Medication Sig Dispense Refill   albuterol (VENTOLIN HFA) 108 (90 Base) MCG/ACT inhaler Inhale 2 puffs into the lungs  as needed.     Apoaequorin (PREVAGEN) 10 MG CAPS Take 1 tablet by mouth daily.     Ascorbic Acid (VITAMIN C) 1000 MG tablet Take 1,000 mg by mouth daily.     aspirin EC 81 MG tablet Take 81 mg by mouth daily.     atorvastatin (LIPITOR) 80 MG tablet TAKE 1 TABLET BY MOUTH EVERY EVENING 30 tablet 6   betamethasone dipropionate 0.05 % cream Apply 1 application topically daily.     Biotin 1000 MCG tablet Take 1,000 mcg by mouth daily.     budesonide-formoterol (SYMBICORT) 160-4.5 MCG/ACT inhaler Inhale 2 puffs into the lungs 2 (two) times daily.     BYDUREON BCISE 2 MG/0.85ML AUIJ Inject into the skin once a week.     Calcium Carbonate-Vitamin D (CALTRATE 600+D PO) Take 1 tablet by mouth daily.      carvedilol (COREG) 25 MG tablet Take 1 tablet (25 mg total) by mouth 2 (two) times daily. 180 tablet 3   Cholecalciferol (VITAMIN D3) 2000 units TABS Take 1 tablet by mouth daily.     cyclobenzaprine (FLEXERIL) 10 MG tablet  Take 10 mg by mouth at bedtime.     diclofenac Sodium (VOLTAREN) 1 % GEL Apply 4 g topically 4 (four) times daily.     ferrous sulfate 325 (65 FE) MG tablet Take 325 mg by mouth daily with breakfast.     folic acid (FOLVITE) 1 MG tablet Take 1 mg by mouth daily.     furosemide (LASIX) 20 MG tablet TAKE 40 MG FOR 3 DAYS THEN RESUME 20 MG DAILY MAY TAKE ADDITIONAL 20 MG AS NEEDED 96 tablet 1   gabapentin (NEURONTIN) 300 MG capsule Take 1 capsule by mouth at bedtime.  2   HYDROcodone-acetaminophen (NORCO) 7.5-325 MG per tablet Take 1 tablet by mouth 2 (two) times daily as needed for moderate pain.     ibuprofen (ADVIL,MOTRIN) 800 MG tablet Take 800 mg by mouth 2 (two) times daily as needed.     losartan (COZAAR) 25 MG tablet TAKE 1 TABLET BY MOUTH EVERY DAY 90 tablet 1   magnesium gluconate (MAGONATE) 500 MG tablet Take 500 mg by mouth daily.     Multiple Vitamin (MULTIVITAMIN) tablet Take 1 tablet by mouth daily.     multivitamin-iron-minerals-folic acid (CENTRUM) chewable tablet Chew  1 tablet by mouth daily.     nisoldipine (SULAR) 34 MG 24 hr tablet Take 1 tablet (34 mg total) by mouth daily. 30 tablet 6   nitroGLYCERIN (NITROSTAT) 0.4 MG SL tablet Place 1 tablet (0.4 mg total) under the tongue every 5 (five) minutes as needed for chest pain. 25 tablet 3   omeprazole (PRILOSEC) 20 MG capsule Take 20 mg by mouth daily.     sildenafil (VIAGRA) 100 MG tablet Take 100 mg by mouth daily as needed for erectile dysfunction.     terazosin (HYTRIN) 1 MG capsule Take 1 mg by mouth at bedtime.     Turmeric (QC TUMERIC COMPLEX) 500 MG CAPS Take 1 tablet by mouth daily.     vitamin B-12 (CYANOCOBALAMIN) 1000 MCG tablet Take 1,000 mcg by mouth daily.     No current facility-administered medications for this visit.     Past Surgical History:  Procedure Laterality Date   APPENDECTOMY  1948   BACK SURGERY     CARDIAC CATHETERIZATION  05/1990   CARDIAC CATHETERIZATION N/A 01/06/2016   Procedure: Left Heart Cath and Coronary Angiography;  Surgeon: Troy Sine, MD;  Location: [place] [facility];  Service: Cardiovascular;  Laterality: N/A;   CARDIAC CATHETERIZATION N/A 01/06/2016   Procedure: Coronary Stent Intervention;  Surgeon: Troy Sine, MD;  Location: [place] [facility];  Service: Cardiovascular;  Laterality: N/A;  RCA mid   CATARACT EXTRACTION W/ INTRAOCULAR LENS  IMPLANT, BILATERAL Bilateral    POSTERIOR LUMBAR FUSION  08/1982; 05/1996     Allergies  Allergen Reactions   Prednisone Other (See Comments)    Tablet form only per patient (paranoid).  Injection form is fine.       Family History  Problem Relation Age of Onset   Heart attack Father        MID 81'S   Cancer Father    Diabetes Mother    Stroke Mother    Diabetes Brother        X'S 2     Social History Edwin Monroe reports that he quit smoking about 18 years ago. His smoking use included cigars. He has never used smokeless tobacco. Edwin Monroe reports current alcohol use.   Review of  Systems CONSTITUTIONAL: No weight loss, fever, chills,  weakness or fatigue.  HEENT: Eyes: No visual loss, blurred vision, double vision or yellow sclerae.No hearing loss, sneezing, congestion, runny nose or sore throat.  SKIN: No rash or itching.  CARDIOVASCULAR: per hpi RESPIRATORY: per hpi GASTROINTESTINAL: No anorexia, nausea, vomiting or diarrhea. No abdominal pain or blood.  GENITOURINARY: No burning on urination, no polyuria NEUROLOGICAL: No headache, dizziness, syncope, paralysis, ataxia, numbness or tingling in the extremities. No change in bowel or bladder control.  MUSCULOSKELETAL: No muscle, back pain, joint pain or stiffness.  LYMPHATICS: No enlarged nodes. No history of splenectomy.  PSYCHIATRIC: No history of depression or anxiety.  ENDOCRINOLOGIC: No reports of sweating, cold or heat intolerance. No polyuria or polydipsia.  Marland Kitchen   Physical Examination Today's Vitals   11/25/20 1340  BP: (!) 148/90  Pulse: 69  SpO2: 95%  Weight: 216 lb 6.4 oz (98.2 kg)  Height: 5\' 8"  (1.727 m)   Body mass index is 32.9 kg/m.  Gen: resting comfortably, no acute distress HEENT: no scleral icterus, pupils equal round and reactive, no palptable cervical adenopathy,  CV: RRR, no mr/g, no jvd Resp: Clear to auscultation bilaterally GI: abdomen is soft, non-tender, non-distended, normal bowel sounds, no hepatosplenomegaly MSK: extremities are warm, no edema.  Skin: warm, no rash Neuro:  no focal deficits Psych: appropriate affect   Diagnostic Studies  [facility] Cath 05/30/1990   DIAGNOSTIC SUMMARY Coronary Artery Disease RCA system: normal Left Main: normal No significant CAD indicated LAD system: normal Left Ventriculogram LCX system: normal Ejection Fraction: 71%  COMMENT The LAD curls the Apex. The RCA and Cirx arteries both contribute small PDA's.      07/2013 Echo   LVEF 45-50%, mild LVH, grade I diastolic dysfunction, hypokinesis of the apical anterior and apical latera  walls.     12/2015 cath Mid RCA-1 lesion, 30 %stenosed. A STENT SYNERGY DES 2.75X20 drug eluting stent was successfully placed. Mid RCA-2 lesion, 80 %stenosed. Post intervention, there is a 0% residual stenosis. The left ventricular systolic function is normal. LV end diastolic pressure is normal. The left ventricular ejection fraction is 55-65% by visual estimate. There is no mitral valve regurgitation.   Normal LV function with an ejection fraction of approximately 55-60%. LVEDP 13 mm Hg.   Normal left coronary coronary circulation with a large LAD and circumflex vessel and diminutive ramus intermediate vessel.   Single vessel CAD with 30% followed by 80% mid RCA stenosis.   Successful PCI with PTCA/DES stenting of the mid RCA with ultimate insertion of a 2.7x 20 mm Synergy DES stent postdilated to 2.92 mm with the stenoses being reduced to 0%.   Postcath RECOMMENDATION: The patient will continue with dual antiplatelet and medical therapy.  Aggressive lipid intervention with a target LDL less than 70 will be undertaken.  The patient will follow up with Dr. Carlyle Dolly.     07/2016 Exercise nuclear stress Blood pressure demonstrated a hypertensive response to exercise. There was no ST segment deviation noted during stress. The study is normal. There are no perfusion defects consistent with prior infarct or current ishcemia. This is a low risk study. The left ventricular ejection fraction is hyperdynamic (>65%).     01/2017 PFTs Mild ventilatory defect without obstruction, moderately reduced TLC, high airway resistance.      Assessment and Plan  1. CAD - no symptoms, conitnue current meds   2. Chronic diastolic HF - no rcent symptoms he will continue current meds   3. HTN   - home bp's  are at goal, continue current meds   4. Hyperlipidemia -continue statin, he is in between pcp's at this time. F/u labs when available.       Arnoldo Lenis, M.D.

## 2020-11-25 NOTE — Patient Instructions (Signed)
Medication Instructions:  Continue all current medications.   Labwork: none  Testing/Procedures: none  Follow-Up: 6 months   Any Other Special Instructions Will Be Listed Below (If Applicable).   If you need a refill on your cardiac medications before your next appointment, please call your pharmacy.  

## 2021-02-03 ENCOUNTER — Ambulatory Visit (HOSPITAL_COMMUNITY)
Admission: RE | Admit: 2021-02-03 | Discharge: 2021-02-03 | Disposition: A | Discharge status: Home / Self Care | Payer: Medicare Other | Source: Ambulatory Visit | Attending: Pulmonary Disease | Admitting: Pulmonary Disease

## 2021-02-03 ENCOUNTER — Encounter: Payer: Self-pay | Admitting: Pulmonary Disease

## 2021-02-03 ENCOUNTER — Other Ambulatory Visit: Payer: Self-pay

## 2021-02-03 ENCOUNTER — Ambulatory Visit (INDEPENDENT_AMBULATORY_CARE_PROVIDER_SITE_OTHER): Payer: Medicare Other | Admitting: Pulmonary Disease

## 2021-02-03 DIAGNOSIS — J4520 Mild intermittent asthma, uncomplicated: Secondary | ICD-10-CM | POA: Insufficient documentation

## 2021-02-03 DIAGNOSIS — I2510 Atherosclerotic heart disease of native coronary artery without angina pectoris: Secondary | ICD-10-CM | POA: Diagnosis not present

## 2021-02-03 DIAGNOSIS — G4733 Obstructive sleep apnea (adult) (pediatric): Secondary | ICD-10-CM | POA: Diagnosis not present

## 2021-02-03 NOTE — Assessment & Plan Note (Signed)
Appears to have mild intermittent symptoms. Will continue to use albuterol on an as-needed basis for wheezing.  I do not feel strongly about Spiriva.  If he has a flareup he will need to go back on Symbicort

## 2021-02-03 NOTE — Progress Notes (Signed)
   Subjective:    Patient ID: Edwin Monroe, male    DOB: 05/09/1946, 75 y.o.   MRN: [id number]  HPI  75 yo for FU of mild persistent asthma and OSA.   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  PMH -colon cancer, CAD status post stent to RCA 2017   Chief Complaint  Patient presents with   Follow-up    Experiencing some SOB when walking around but not usually when sitting down. Sw[facility] inhal[facility] a [facility] ago [facility]. Switched from symbicort to spiriva. Was using symbicort every day.      Last OV 10/2019  He was maintained on Symbicort previously.  For [facility] reason the [facility] discontinued this and he was changed to Spiriva.  He continues to use albuterol about twice a week He has not had a major flareup requiring prednisone. Last office visit we ordered chest x-ray, he went to [facility] and had this done, I reviewed report which shows lingular infiltrate?  Early bronchopneumonia.  We were never called about this report and he did not have any symptoms  He obtained a new CPAP through the [place], this is working well, no problems with mask or pressure He reports good compliance     Significant tests/ events reviewed NPS[facility] ([facility])  12/2012 -214 lbs - inadequate titration bipap 14/10 tried, bipap download 2015 17/13 >> reisdual AHI 11/h, mainly obstructive   PFTs 01/2017 -ratio of 83, FEV1 84%, FVC 76%, TLC 60%, DLCO 66% but corrects to 97% for alveolar volume >> moderate extraparenchymal restriction consistent with obesity  Review of Systems neg for any significant sore throat, dysphagia, itching, sneezing, nasal congestion or excess/ purulent secretions, fever, chills, sweats, unintended wt loss, pleuritic or exertional cp, hempoptysis, orthopnea pnd or change in chronic leg swelling. Also denies presyncope, palpitations, heartburn, abdominal pain, nausea, vomiting,  diarrhea or change in bowel or urinary habits, dysuria,hematuria, rash, arthralgias, visual complaints, headache, numbness weakness or ataxia.     Objective:   Physical Exam  Gen. Pleasant, obese, in no distress ENT - no lesions, no post nasal drip Neck: No JVD, no thyromegaly, no carotid bruits Lungs: no use of accessory muscles, no dullness to percussion, decreased without rales or rhonchi  Cardiovascular: Rhythm regular, heart sounds  normal, no murmurs or gallops, no peripheral edema Musculoskeletal: No deformities, no cyanosis or clubbing , no tremors       Assessment & Plan:    Abnormal chest x-ray -10/2019. We will follow-up chest x-ray for resolution.  Does not appear to have been symptomatic last time  Reviewed >> appears clear

## 2021-02-03 NOTE — Patient Instructions (Signed)
CXR today to follow up Use albuterol as needed for wheezing If you have a flare up, we may have to go back to symbicort

## 2021-02-03 NOTE — Assessment & Plan Note (Addendum)
Being managed by the [place].  He reports good compliance Im unable to obtain report on his CPAP

## 2021-03-22 DIAGNOSIS — E1165 Type 2 diabetes mellitus with hyperglycemia: Secondary | ICD-10-CM | POA: Insufficient documentation

## 2021-03-22 DIAGNOSIS — M545 Low back pain, unspecified: Secondary | ICD-10-CM | POA: Insufficient documentation

## 2021-04-20 ENCOUNTER — Other Ambulatory Visit: Payer: Self-pay | Admitting: Cardiology

## 2021-05-27 ENCOUNTER — Ambulatory Visit (INDEPENDENT_AMBULATORY_CARE_PROVIDER_SITE_OTHER): Payer: Medicare Other | Admitting: Cardiology

## 2021-05-27 ENCOUNTER — Other Ambulatory Visit: Payer: Self-pay

## 2021-05-27 ENCOUNTER — Encounter: Payer: Self-pay | Admitting: Cardiology

## 2021-05-27 VITALS — BP 118/60 | HR 80 | Ht 68.0 in | Wt 216.8 lb

## 2021-05-27 DIAGNOSIS — I5032 Chronic diastolic (congestive) heart failure: Secondary | ICD-10-CM | POA: Diagnosis not present

## 2021-05-27 DIAGNOSIS — I10 Essential (primary) hypertension: Secondary | ICD-10-CM | POA: Diagnosis not present

## 2021-05-27 DIAGNOSIS — I2510 Atherosclerotic heart disease of native coronary artery without angina pectoris: Secondary | ICD-10-CM | POA: Diagnosis not present

## 2021-05-27 DIAGNOSIS — E782 Mixed hyperlipidemia: Secondary | ICD-10-CM

## 2021-05-27 NOTE — Progress Notes (Signed)
Clinical Summary [name] is a 75 y.o.male seen today for follow up of the following medical problems.   1. CAD - notes indicate remote history of MI in early 49s in [place] at [employer] [place]. Transferred to [facility] at that time,cath at that time showed patent coronaries  -  Received DES to RCA 12/2015. LVEF by LVgram at that time 55-65% - 07/2016 exercise nuclear stress: no ischemia       - no chest pains, some SOB and wheezing.  - compliant with meds   2. HTN   -he is compliant with meds     3. OSA   - poor compliance with [facility]AP - [facility]     4. Hyperlipidemia  02/2021 TC 112 TG 156 HDL 39 LDL 41 - he is on a statin   5. DM2 - followed by pcp   6. SOB - abnormal PFTs as described below.  - followed by pulmonary   7. Colon cancer - prior surgery in [place], [place]     8. AAA screen - Jan 2015 CT abd no AAA reported       Past Medical History:  Diagnosis Date   Agent orange exposure [phone]   Anxiety disorder    Arthritis    "back, knees, shoulders" (01/06/2016)   Asthma    CAD (coronary artery disease)    a. remote MI 1991 with OK cath at that time. b. LHC 01/06/16:  PTCA/DES to mRCA, left coronary system OK, LVEF 55-65%.    Cancer, colon ([place])    Cardiomyopathy ([place])    a. EF 45-50% in 2015. b. LVEF 55-65% in 2017.   Chronic lower back pain    GERD (gastroesophageal reflux disease)    Hypertension    Hypothyroidism    "got nuclear treatment in the 70's"   Myocardial infarction ([place]) 04/1990    in [place] at [place]/notes 08/21/2013   OSA on CPAP    Prediabetes      Allergies  Allergen Reactions   Prednisone Other (See Comments)    Tablet form only per patient (paranoid).  Injection form is fine.      Current Outpatient Medications  Medication Sig Dispense Refill   albuterol (VENTOLIN HFA) 108 (90 Base) MCG/ACT inhaler Inhale 2 puffs into the lungs as needed.     Apoaequorin (PREVAGEN) 10 MG CAPS Take 1 tablet by mouth daily.      Ascorbic Acid (VITAMIN C) 1000 MG tablet Take 1,000 mg by mouth daily.     aspirin EC 81 MG tablet Take 81 mg by mouth daily.     atorvastatin (LIPITOR) 80 MG tablet TAKE 1 TABLET BY MOUTH EVERY EVENING 30 tablet 6   betamethasone dipropionate 0.05 % cream Apply 1 application topically daily.     Biotin 1000 MCG tablet Take 1,000 mcg by mouth daily.     BYDUREON BCISE 2 MG/0.85ML AUIJ Inject into the skin once a week.     Calcium Carbonate-Vitamin D (CALTRATE 600+D PO) Take 1 tablet by mouth daily.      carvedilol (COREG) 25 MG tablet Take 1 tablet (25 mg total) by mouth 2 (two) times daily. 180 tablet 3   Cholecalciferol (VITAMIN D3) 2000 units TABS Take 1 tablet by mouth daily.     cyclobenzaprine (FLEXERIL) 10 MG tablet Take 10 mg by mouth at bedtime.     diclofenac Sodium (VOLTAREN) 1 % GEL Apply 4 g topically 4 (four) times daily.     ferrous  sulfate 325 (65 FE) MG tablet Take 325 mg by mouth daily with breakfast.     folic acid (FOLVITE) 1 MG tablet Take 1 mg by mouth daily.     furosemide (LASIX) 20 MG tablet TAKE 40 MG FOR 3 DAYS THEN RESUME 20 MG DAILY MAY TAKE ADDITIONAL 20 MG AS NEEDED 96 tablet 1   gabapentin (NEURONTIN) 300 MG capsule Take 1 capsule by mouth at bedtime.  2   HYDROcodone-acetaminophen (NORCO) 7.5-325 MG per tablet Take 1 tablet by mouth 2 (two) times daily as needed for moderate pain.     ibuprofen (ADVIL,MOTRIN) 800 MG tablet Take 800 mg by mouth 2 (two) times daily as needed.     losartan (COZAAR) 25 MG tablet TAKE 1 TABLET BY MOUTH EVERY DAY 90 tablet 1   magnesium gluconate (MAGONATE) 500 MG tablet Take 500 mg by mouth daily.     Multiple Vitamin (MULTIVITAMIN) tablet Take 1 tablet by mouth daily.     multivitamin-iron-minerals-folic acid (CENTRUM) chewable tablet Chew 1 tablet by mouth daily.     nisoldipine (SULAR) 34 MG 24 hr tablet Take 1 tablet (34 mg total) by mouth daily. 30 tablet 6   nitroGLYCERIN (NITROSTAT) 0.4 MG SL tablet Place 1 tablet (0.4 mg  total) under the tongue every 5 (five) minutes as needed for chest pain. 25 tablet 3   omeprazole (PRILOSEC) 20 MG capsule Take 20 mg by mouth daily.     sildenafil (VIAGRA) 100 MG tablet Take 100 mg by mouth daily as needed for erectile dysfunction.     terazosin (HYTRIN) 1 MG capsule Take 1 mg by mouth at bedtime.     Tiotropium Bromide Monohydrate (SPIRIVA RESPIMAT) 2.5 MCG/ACT AERS Inhale into the lungs.     Turmeric 500 MG CAPS Take 1 tablet by mouth daily.     vitamin B-12 (CYANOCOBALAMIN) 1000 MCG tablet Take 1,000 mcg by mouth daily.     No current facility-administered medications for this visit.     Past Surgical History:  Procedure Laterality Date   APPENDECTOMY  1948   BACK SURGERY     CARDIAC CATHETERIZATION  05/1990   CARDIAC CATHETERIZATION N/A 01/06/2016   Procedure: Left Heart Cath and Coronary Angiography;  Surgeon: Troy Sine, MD;  Location: [facility];  Service: Cardiovascular;  Laterality: N/A;   CARDIAC CATHETERIZATION N/A 01/06/2016   Procedure: Coronary Stent Intervention;  Surgeon: Troy Sine, MD;  Location: [facility];  Service: Cardiovascular;  Laterality: N/A;  RCA mid   CATARACT EXTRACTION W/ INTRAOCULAR LENS  IMPLANT, BILATERAL Bilateral    POSTERIOR LUMBAR FUSION  08/1982; 05/1996     Allergies  Allergen Reactions   Prednisone Other (See Comments)    Tablet form only per patient (paranoid).  Injection form is fine.       Family History  Problem Relation Age of Onset   Heart attack Father        MID 13'S   Cancer Father    Diabetes Mother    Stroke Mother    Diabetes Brother        X'S 2     Social History [name] reports that he quit smoking about 19 years ago. His smoking use included cigars. He has never used smokeless tobacco. [name] reports current alcohol use.   Review of Systems CONSTITUTIONAL: No weight loss, fever, chills, weakness or fatigue.  HEENT: Eyes: No visual loss, blurred vision, double  vision or yellow sclerae.No hearing  loss, sneezing, congestion, runny nose or sore throat.  SKIN: No rash or itching.  CARDIOVASCULAR: per hpi RESPIRATORY: No shortness of breath, cough or sputum.  GASTROINTESTINAL: No anorexia, nausea, vomiting or diarrhea. No abdominal pain or blood.  GENITOURINARY: No burning on urination, no polyuria NEUROLOGICAL: No headache, dizziness, syncope, paralysis, ataxia, numbness or tingling in the extremities. No change in bowel or bladder control.  MUSCULOSKELETAL: No muscle, back pain, joint pain or stiffness.  LYMPHATICS: No enlarged nodes. No history of splenectomy.  PSYCHIATRIC: No history of depression or anxiety.  ENDOCRINOLOGIC: No reports of sweating, cold or heat intolerance. No polyuria or polydipsia.  Marland Kitchen   Physical Examination Today's Vitals   05/27/21 1134  BP: 118/60  Pulse: 80  Weight: 216 lb 12.8 oz (98.3 kg)  Height: 5\' 8"  (1.727 m)   Body mass index is 32.96 kg/m.  Gen: resting comfortably, no acute distress HEENT: no scleral icterus, pupils equal round and reactive, no palptable cervical adenopathy,  CV: RRR, no m/r/g, no jvd Resp: Clear to ausculton bilaterally GI: abdomen is soft, non-tender, non-distended, normal bowel sounds, no hepatosplenomegaly MSK: extremities are warm, no edema.  Skin: warm, no rash Neuro:  no focal deficits Psych: appropriate affect   Diagnostic Studies  [facility] Cath 05/30/1990   DIAGNOSTIC SUMMARY Coronary Artery Disease RCA system: normal Left Main: normal No significant CAD indicated LAD system: normal Left Ventriculogram LCX system: normal Ejection Fraction: 71%  COMMENT The LAD curls the Apex. The RCA and Cirx arteries both contribute small PDA's.      07/2013 Echo   LVEF 45-50%, mild LVH, grade I diastolic dysfunction, hypokinesis of the apical anterior and apical latera walls.     12/2015 cath Mid RCA-1 lesion, 30 %stenosed. A STENT SYNERGY DES 2.75X20 drug eluting stent was  successfully placed. Mid RCA-2 lesion, 80 %stenosed. Post intervention, there is a 0% residual stenosis. The left ventricular systolic function is normal. LV end diastolic pressure is normal. The left ventricular ejection fraction is 55-65% by visual estimate. There is no mitral valve regurgitation.   Normal LV function with an ejection fraction of approximately 55-60%. LVEDP 13 mm Hg.   Normal left coronary coronary circulation with a large LAD and circumflex vessel and diminutive ramus intermediate vessel.   Single vessel CAD with 30% followed by 80% mid RCA stenosis.   Successful PCI with PTCA/DES stenting of the mid RCA with ultimate insertion of a 2.7x 20 mm Synergy DES stent postdilated to 2.92 mm with the stenoses being reduced to 0%.   Postcath RECOMMENDATION: The patient will continue with dual antiplatelet and medical therapy.  Aggressive lipid intervention with a target LDL less than 70 will be undertaken.  The patient will follow up with Dr. Carlyle Dolly.     07/2016 Exercise nuclear stress Blood pressure demonstrated a hypertensive response to exercise. There was no ST segment deviation noted during stress. The study is normal. There are no perfusion defects consistent with prior infarct or current ishcemia. This is a low risk study. The left ventricular ejection fraction is hyperdynamic (>65%).     01/2017 PFTs Mild ventilatory defect without obstruction, moderately reduced TLC, high airway resistance.    Assessment and Plan  1. CAD - no symptoms, continue current meds   2. Chronic diastolic HF - no symptoms, continue current meds   3. HTN   - he is at goal, continue current meds   4. Hyperlipidemia -at goal, continue current meds.  Arnoldo Lenis, M.D.

## 2021-05-27 NOTE — Patient Instructions (Signed)
Medication Instructions:  Continue all current medications.   Labwork: none  Testing/Procedures: none  Follow-Up: 6 months   Any Other Special Instructions Will Be Listed Below (If Applicable).   If you need a refill on your cardiac medications before your next appointment, please call your pharmacy.  

## 2021-12-05 ENCOUNTER — Encounter: Payer: Self-pay | Admitting: Cardiology

## 2021-12-05 ENCOUNTER — Ambulatory Visit (INDEPENDENT_AMBULATORY_CARE_PROVIDER_SITE_OTHER): Payer: Medicare Other | Admitting: Cardiology

## 2021-12-05 VITALS — BP 145/85 | HR 72 | Ht 68.0 in | Wt 214.2 lb

## 2021-12-05 DIAGNOSIS — I2510 Atherosclerotic heart disease of native coronary artery without angina pectoris: Secondary | ICD-10-CM

## 2021-12-05 DIAGNOSIS — I10 Essential (primary) hypertension: Secondary | ICD-10-CM | POA: Diagnosis not present

## 2021-12-05 DIAGNOSIS — E782 Mixed hyperlipidemia: Secondary | ICD-10-CM | POA: Diagnosis not present

## 2021-12-05 DIAGNOSIS — I5032 Chronic diastolic (congestive) heart failure: Secondary | ICD-10-CM

## 2021-12-05 MED ORDER — FUROSEMIDE 20 MG PO TABS: 20.0000 mg | ORAL_TABLET | Freq: Every day | ORAL | 3 refills | Status: DC

## 2021-12-05 NOTE — Patient Instructions (Signed)
Medication Instructions:  Lasix (Furosemide) refilled today.  Continue all other medications.     Labwork: none  Testing/Procedures: none  Follow-Up: 6 months   Any Other Special Instructions Will Be Listed Below (If Applicable). Please call back at the end of the week with update on blood pressure readings.   If you need a refill on your cardiac medications before your next appointment, please call your pharmacy.

## 2021-12-05 NOTE — Progress Notes (Signed)
Clinical Summary Edwin Monroe is a 76 y.o.male seen today for follow up of the following medical problems.   1. CAD - notes indicate remote history of MI in early 44s in [place] at [place]. Transferred to [facility] at that time,cath at that time showed patent coronaries  -  Received DES to RCA 12/2015. LVEF by LVgram at that time 55-65% - 07/2016 exercise nuclear stress: no ischemia     - chest pains, no SOb/DOe - compliant with meds     2. HTN   -compliant with meds     3. OSA   - poor [facility] - [facility]     4. Hyperlipidemia   02/2021 TC 112 TG 156 HDL 39 LDL 41 - he is on a statin - more recent labs at [facility]   5. DM2 - followed by pcp   6. SOB - abnormal PFTs as described below. Notes mention persistent asthma  - followed by pulmonary Dr Elsworth Soho   7. Colon cancer - prior surgery in [place], [place]     8. AAA screen - Jan 2015 CT abd no AAA reported Past Medical History:  Diagnosis Date   Agent orange exposure [phone]   Anxiety disorder    Arthritis    "back, knees, shoulders" (01/06/2016)   Asthma    CAD (coronary artery disease)    a. remote MI 1991 with OK cath at that time. b. LHC 01/06/16:  PTCA/DES to mRCA, left coronary system OK, LVEF 55-65%.    Cancer, colon ([place])    Cardiomyopathy ([place])    a. EF 45-50% in 2015. b. LVEF 55-65% in 2017.   Chronic lower back pain    GERD (gastroesophageal reflux disease)    Hypertension    Hypothyroidism    "got nuclear treatment in the 70's"   Myocardial infarction ([place]) 04/1990    in [place] at [place]/notes 08/21/2013   OSA on CPAP    Prediabetes      Allergies  Allergen Reactions   Metformin     Other reaction(s): Psychotic disorder Gi intolerance    Prednisone Other (See Comments)    Tablet form only per patient (paranoid).  Injection form is fine.      Current Outpatient Medications  Medication Sig Dispense Refill   albuterol (VENTOLIN HFA) 108 (90 Base) MCG/ACT inhaler  Inhale 2 puffs into the lungs as needed.     Apoaequorin (PREVAGEN) 10 MG CAPS Take 1 tablet by mouth daily.     Ascorbic Acid (VITAMIN C) 1000 MG tablet Take 1,000 mg by mouth daily.     aspirin EC 81 MG tablet Take 81 mg by mouth daily.     atorvastatin (LIPITOR) 80 MG tablet TAKE 1 TABLET BY MOUTH EVERY EVENING 30 tablet 6   betamethasone dipropionate 0.05 % cream Apply 1 application topically daily.     Biotin 1000 MCG tablet Take 1,000 mcg by mouth daily.     BYDUREON BCISE 2 MG/0.85ML AUIJ Inject into the skin once a week.     Calcium Carbonate-Vitamin D (CALTRATE 600+D PO) Take 1 tablet by mouth daily.      carvedilol (COREG) 25 MG tablet Take 1 tablet (25 mg total) by mouth 2 (two) times daily. 180 tablet 3   Cholecalciferol (VITAMIN D3) 2000 units TABS Take 1 tablet by mouth daily.     cyclobenzaprine (FLEXERIL) 10 MG tablet Take 10 mg by mouth at bedtime.     diclofenac Sodium (VOLTAREN)  1 % GEL Apply 4 g topically 4 (four) times daily.     ferrous sulfate 325 (65 FE) MG tablet Take 325 mg by mouth daily with breakfast.     folic acid (FOLVITE) 1 MG tablet Take 1 mg by mouth daily.     furosemide (LASIX) 20 MG tablet TAKE 40 MG FOR 3 DAYS THEN RESUME 20 MG DAILY MAY TAKE ADDITIONAL 20 MG AS NEEDED 96 tablet 1   gabapentin (NEURONTIN) 300 MG capsule Take 1 capsule by mouth at bedtime.  2   HYDROcodone-acetaminophen (NORCO) 7.5-325 MG per tablet Take 1 tablet by mouth 2 (two) times daily as needed for moderate pain.     ibuprofen (ADVIL,MOTRIN) 800 MG tablet Take 800 mg by mouth 2 (two) times daily as needed.     losartan (COZAAR) 25 MG tablet TAKE 1 TABLET BY MOUTH EVERY DAY 90 tablet 1   magnesium gluconate (MAGONATE) 500 MG tablet Take 500 mg by mouth daily.     Multiple Vitamin (MULTIVITAMIN) tablet Take 1 tablet by mouth daily.     multivitamin-iron-minerals-folic acid (CENTRUM) chewable tablet Chew 1 tablet by mouth daily.     nisoldipine (SULAR) 34 MG 24 hr tablet Take 1 tablet  (34 mg total) by mouth daily. 30 tablet 6   nitroGLYCERIN (NITROSTAT) 0.4 MG SL tablet Place 1 tablet (0.4 mg total) under the tongue every 5 (five) minutes as needed for chest pain. 25 tablet 3   omeprazole (PRILOSEC) 20 MG capsule Take 20 mg by mouth daily.     senna (SENOKOT) 8.6 MG tablet Take 1 tablet by mouth daily as needed.     sildenafil (VIAGRA) 100 MG tablet Take 100 mg by mouth daily as needed for erectile dysfunction.     terazosin (HYTRIN) 1 MG capsule Take 1 mg by mouth at bedtime.     Tiotropium Bromide Monohydrate (SPIRIVA RESPIMAT) 2.5 MCG/ACT AERS Inhale into the lungs.     Turmeric 500 MG CAPS Take 1 tablet by mouth daily. (Patient not taking: Reported on 05/27/2021)     vitamin B-12 (CYANOCOBALAMIN) 1000 MCG tablet Take 1,000 mcg by mouth daily.     No current facility-administered medications for this visit.     Past Surgical History:  Procedure Laterality Date   APPENDECTOMY  1948   BACK SURGERY     CARDIAC CATHETERIZATION  05/1990   CARDIAC CATHETERIZATION N/A 01/06/2016   Procedure: Left Heart Cath and Coronary Angiography;  Surgeon: Troy Sine, MD;  Location: [facility];  Service: Cardiovascular;  Laterality: N/A;   CARDIAC CATHETERIZATION N/A 01/06/2016   Procedure: Coronary Stent Intervention;  Surgeon: Troy Sine, MD;  Location: [facility];  Service: Cardiovascular;  Laterality: N/A;  RCA mid   CATARACT EXTRACTION W/ INTRAOCULAR LENS  IMPLANT, BILATERAL Bilateral    POSTERIOR LUMBAR FUSION  08/1982; 05/1996     Allergies  Allergen Reactions   Metformin     Other reaction(s): Psychotic disorder Gi intolerance    Prednisone Other (See Comments)    Tablet form only per patient (paranoid).  Injection form is fine.       Family History  Problem Relation Age of Onset   Heart attack Father        MID 31'S   Cancer Father    Diabetes Mother    Stroke Mother    Diabetes Brother        X'S 2     Social History Edwin Monroe  reports  that he quit smoking about 19 years ago. His smoking use included cigars. He has never used smokeless tobacco. Edwin Monroe reports current alcohol use.   Review of Systems CONSTITUTIONAL: No weight loss, fever, chills, weakness or fatigue.  HEENT: Eyes: No visual loss, blurred vision, double vision or yellow sclerae.No hearing loss, sneezing, congestion, runny nose or sore throat.  SKIN: No rash or itching.  CARDIOVASCULAR: per hpi RESPIRATORY: No shortness of breath, cough or sputum.  GASTROINTESTINAL: No anorexia, nausea, vomiting or diarrhea. No abdominal pain or blood.  GENITOURINARY: No burning on urination, no polyuria NEUROLOGICAL: No headache, dizziness, syncope, paralysis, ataxia, numbness or tingling in the extremities. No change in bowel or bladder control.  MUSCULOSKELETAL: No muscle, back pain, joint pain or stiffness.  LYMPHATICS: No enlarged nodes. No history of splenectomy.  PSYCHIATRIC: No history of depression or anxiety.  ENDOCRINOLOGIC: No reports of sweating, cold or heat intolerance. No polyuria or polydipsia.  Marland Kitchen   Physical Examination Today's Vitals   12/05/21 1527  BP: (!) 146/80  Pulse: 72  SpO2: 97%  Weight: 214 lb 3.2 oz (97.2 kg)  Height: '5\' 8"'$  (1.727 m)   Body mass index is 32.57 kg/m.  Gen: resting comfortably, no acute distress HEENT: no scleral icterus, pupils equal round and reactive, no palptable cervical adenopathy,  CV: RRR, no m/r/g, no jvd Resp: Clear to auscultation bilaterally GI: abdomen is soft, non-tender, non-distended, normal bowel sounds, no hepatosplenomegaly MSK: extremities are warm, no edema.  Skin: warm, no rash Neuro:  no focal deficits Psych: appropriate affect   Diagnostic Studies  [facility] Cath 05/30/1990   DIAGNOSTIC SUMMARY Coronary Artery Disease RCA system: normal Left Main: normal No significant CAD indicated LAD system: normal Left Ventriculogram LCX system: normal Ejection Fraction: 71%  COMMENT The  LAD curls the Apex. The RCA and Cirx arteries both contribute small PDA's.      07/2013 Echo   LVEF 45-50%, mild LVH, grade I diastolic dysfunction, hypokinesis of the apical anterior and apical latera walls.     12/2015 cath Mid RCA-1 lesion, 30 %stenosed. A STENT SYNERGY DES 2.75X20 drug eluting stent was successfully placed. Mid RCA-2 lesion, 80 %stenosed. Post intervention, there is a 0% residual stenosis. The left ventricular systolic function is normal. LV end diastolic pressure is normal. The left ventricular ejection fraction is 55-65% by visual estimate. There is no mitral valve regurgitation.   Normal LV function with an ejection fraction of approximately 55-60%. LVEDP 13 mm Hg.   Normal left coronary coronary circulation with a large LAD and circumflex vessel and diminutive ramus intermediate vessel.   Single vessel CAD with 30% followed by 80% mid RCA stenosis.   Successful PCI with PTCA/DES stenting of the mid RCA with ultimate insertion of a 2.7x 20 mm Synergy DES stent postdilated to 2.92 mm with the stenoses being reduced to 0%.   Postcath RECOMMENDATION: The patient will continue with dual antiplatelet and medical therapy.  Aggressive lipid intervention with a target LDL less than 70 will be undertaken.  The patient will follow up with Dr. Carlyle Dolly.     07/2016 Exercise nuclear stress Blood pressure demonstrated a hypertensive response to exercise. There was no ST segment deviation noted during stress. The study is normal. There are no perfusion defects consistent with prior infarct or current ishcemia. This is a low risk study. The left ventricular ejection fraction is hyperdynamic (>65%).     01/2017 PFTs Mild ventilatory defect without obstruction, moderately reduced TLC, high airway  resistance.    Assessment and Plan  1. CAD - no recent symptoms - EKG shows NSR without ischemic changes - continue current meds   2. Chronic diastolic HF -  euvolemic without symptoms, continue current meds   3. HTN   - elevated In clinic, had been well controlled. He will update [place] on home bp's at end of the week, room to titrate losartan if needed.    4. Hyperlipidemia -requset labs from [facility], continue currnet meds  F/u 6 months      Arnoldo Lenis, M.D.

## 2021-12-06 ENCOUNTER — Encounter: Payer: Self-pay | Admitting: *Deleted

## 2021-12-09 ENCOUNTER — Telehealth: Payer: Self-pay | Admitting: Cardiology

## 2021-12-09 DIAGNOSIS — I10 Essential (primary) hypertension: Secondary | ICD-10-CM

## 2021-12-09 DIAGNOSIS — I5032 Chronic diastolic (congestive) heart failure: Secondary | ICD-10-CM

## 2021-12-09 NOTE — Telephone Encounter (Signed)
Pt c/o BP issue: STAT if pt c/o blurred vision, one-sided weakness or slurred speech  1. What are your last 5 BP readings?   7/11  138/77 7/12   141/82 7/13   140/78 7/14  138/77    2. Are you having any other symptoms (ex. Dizziness, headache, blurred vision, passed out)?   No  3. What is your BP issue?    Patient called stating he is following-up to report his BP readings as requested by Dr. Harl Bowie.

## 2021-12-12 NOTE — Telephone Encounter (Signed)
BP's on average too high, please increase losartan to '50mg'$  daily and will need a bmet 2 weeks   Zandra Abts MD

## 2021-12-12 NOTE — Telephone Encounter (Signed)
lmtcb

## 2021-12-14 NOTE — Telephone Encounter (Signed)
2nd attempt lmtcb

## 2021-12-20 MED ORDER — LOSARTAN POTASSIUM 50 MG PO TABS: 50.0000 mg | ORAL_TABLET | Freq: Every day | ORAL | 6 refills | Status: AC

## 2021-12-20 NOTE — Telephone Encounter (Signed)
Patient notified and verbalized understanding.  New prescription sent to [facility] now.  Lab order mailed to patient home - he will do at hospital in [place], [place].

## 2022-02-14 ENCOUNTER — Ambulatory Visit (INDEPENDENT_AMBULATORY_CARE_PROVIDER_SITE_OTHER): Payer: Medicare Other | Admitting: Pulmonary Disease

## 2022-02-14 ENCOUNTER — Encounter: Payer: Self-pay | Admitting: Pulmonary Disease

## 2022-02-14 DIAGNOSIS — J4520 Mild intermittent asthma, uncomplicated: Secondary | ICD-10-CM | POA: Diagnosis not present

## 2022-02-14 DIAGNOSIS — G4733 Obstructive sleep apnea (adult) (pediatric): Secondary | ICD-10-CM | POA: Diagnosis not present

## 2022-02-14 DIAGNOSIS — I2510 Atherosclerotic heart disease of native coronary artery without angina pectoris: Secondary | ICD-10-CM | POA: Diagnosis not present

## 2022-02-14 NOTE — Patient Instructions (Signed)
  Use claritin or zyrtec OTC if allergies get worse

## 2022-02-14 NOTE — Assessment & Plan Note (Signed)
He is compliant with his CPAP machine.  He gets CPAP supplies from the [place]. We discussed hypoglossal nerve stimulation therapy and he would like to avoid implant if needed. CPAP has certainly helped improve his daytime somnolence and fatigue

## 2022-02-14 NOTE — Assessment & Plan Note (Signed)
Appears mild intermittent.  Slight increased use of albuterol inhaler but he feels its related to the weather and does not want to get on a maintenance pump. For some reason he has been prescribed Spiriva by the [place] but he is not taking this anyways. We discussed use of OTC antihistaminic such as Claritin or Zyrtec should his allergies get worse

## 2022-02-14 NOTE — Progress Notes (Signed)
   Subjective:    Patient ID: Edwin Monroe, male    DOB: 07/13/1945, 76 y.o.   MRN: [id number]  HPI  76 yo never smoker for FU of mild persistent asthma and OSA.   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   PMH -colon cancer, CAD status post stent to RCA 2017  Chief Complaint  Patient presents with   Follow-up    Asthma is doing well but patient feels he is more SOB than he was last ov   Annual follow-up visit. He reports slight increase in his dyspnea with intermittent wheezing for which she uses albuterol about twice daily.  He wonders if this is related to the fall his allergies have been worse this year. He uses OTC Sudafed as needed, his wife uses antihistaminics he denies significant reflux. He gets all his medications from the [place], medication list shows Spiriva but he admits to not using this.  He was supplied with CPAP from the [place].  Reports using this every night.  Denies daytime somnolence or sleep pressure.  Wife has not noted snoring.  No problems with mask or pressure   Significant tests/ events reviewed  [facility]  12/2012 -214 lbs - inadequate titration bipap 14/10 tried, bipap download 2015 17/13 >> reisdual AHI 11/h, mainly obstructive   PFTs 01/2017 -ratio of 83, FEV1 84%, FVC 76%, TLC 60%, DLCO 66% but corrects to 97% for alveolar volume >> moderate extraparenchymal restriction consistent with obesity  Review of Systems  neg for any significant sore throat, dysphagia, itching, sneezing, nasal congestion or excess/ purulent secretions, fever, chills, sweats, unintended wt loss, pleuritic or exertional cp, hempoptysis, orthopnea pnd or change in chronic leg swelling. Also denies presyncope, palpitations, heartburn, abdominal pain, nausea, vomiting, diarrhea or change in bowel or urinary habits, dysuria,hematuria, rash, arthralgias, visual  complaints, headache, numbness weakness or ataxia.     Objective:   Physical Exam  Gen. Pleasant, obese, in no distress ENT - no lesions, no post nasal drip Neck: No JVD, no thyromegaly, no carotid bruits Lungs: no use of accessory muscles, no dullness to percussion, decreased without rales or rhonchi  Cardiovascular: Rhythm regular, heart sounds  normal, no murmurs or gallops, no peripheral edema Musculoskeletal: No deformities, no cyanosis or clubbing , no tremors        Assessment & Plan:

## 2022-04-05 DIAGNOSIS — N529 Male erectile dysfunction, unspecified: Secondary | ICD-10-CM | POA: Insufficient documentation

## 2022-04-05 DIAGNOSIS — E669 Obesity, unspecified: Secondary | ICD-10-CM | POA: Insufficient documentation

## 2022-04-05 DIAGNOSIS — I259 Chronic ischemic heart disease, unspecified: Secondary | ICD-10-CM | POA: Insufficient documentation

## 2022-04-05 DIAGNOSIS — E1142 Type 2 diabetes mellitus with diabetic polyneuropathy: Secondary | ICD-10-CM | POA: Insufficient documentation

## 2022-04-05 DIAGNOSIS — E785 Hyperlipidemia, unspecified: Secondary | ICD-10-CM | POA: Insufficient documentation

## 2022-04-05 DIAGNOSIS — N400 Enlarged prostate without lower urinary tract symptoms: Secondary | ICD-10-CM | POA: Insufficient documentation

## 2022-06-08 ENCOUNTER — Encounter: Payer: Self-pay | Admitting: Cardiology

## 2022-06-08 ENCOUNTER — Ambulatory Visit: Payer: Medicare Other | Attending: Cardiology | Admitting: Cardiology

## 2022-06-08 VITALS — BP 120/65 | HR 71 | Ht 66.0 in | Wt 203.0 lb

## 2022-06-08 DIAGNOSIS — E782 Mixed hyperlipidemia: Secondary | ICD-10-CM | POA: Diagnosis not present

## 2022-06-08 DIAGNOSIS — I5032 Chronic diastolic (congestive) heart failure: Secondary | ICD-10-CM

## 2022-06-08 DIAGNOSIS — I2510 Atherosclerotic heart disease of native coronary artery without angina pectoris: Secondary | ICD-10-CM

## 2022-06-08 DIAGNOSIS — I10 Essential (primary) hypertension: Secondary | ICD-10-CM

## 2022-06-08 NOTE — Progress Notes (Signed)
Clinical Summary Edwin Monroe is a 77 y.o.male seen today for follow up of the following medical problems.    1. CAD - notes indicate remote history of MI in early 18s in [place] at [place]. Transferred to [facility] at that time,cath at that time showed patent coronaries  -  Received DES to RCA 12/2015. LVEF by [facility] at that time 55-65% - 07/2016 exercise nuclear stress: no ischemia     - no chest pains, SOB at times which improves inhaler.  - compliant with meds     2. HTN   -he is compliant with meds - 03/2022 130/66 - 11/2021 down 11 lbs since.      3. OSA   - [facility]     4. Hyperlipidemia   02/2021 TC 112 TG 156 HDL 39 LDL 41 - he is on a statin - more recent labs at [facility]  03/2022 TC 93 HDL 36 TG 100 LDL 37   5. DM2 - followed by pcp 03/2022 HgbA1c 6   6. SOB - abnormal PFTs as described below. Notes mention persistent asthma  - followed by pulmonary Dr Elsworth Soho   7. Colon cancer - prior surgery in [place], [place]     8. AAA screen - Jan 2015 CT abd no AAA reported Past Medical History:  Diagnosis Date   Agent orange exposure [phone]   Anxiety disorder    Arthritis    "back, knees, shoulders" (01/06/2016)   Asthma    CAD (coronary artery disease)    a. remote MI 1991 with OK cath at that time. b. LHC 01/06/16:  PTCA/DES to mRCA, left coronary system OK, LVEF 55-65%.    Cancer, colon ([place])    Cardiomyopathy ([place])    a. EF 45-50% in 2015. b. LVEF 55-65% in 2017.   Chronic lower back pain    GERD (gastroesophageal reflux disease)    Hypertension    Hypothyroidism    "got nuclear treatment in the 70's"   Myocardial infarction ([place]) 04/1990    in [place] at [place]/notes 08/21/2013   OSA on CPAP    Prediabetes      Allergies  Allergen Reactions   Metformin     Other reaction(s): Psychotic disorder Gi intolerance    Prednisone Other (See Comments)    Tablet form only per patient (paranoid).  Injection  form is fine.      Current Outpatient Medications  Medication Sig Dispense Refill   albuterol (VENTOLIN HFA) 108 (90 Base) MCG/ACT inhaler Inhale 2 puffs into the lungs as needed.     Apoaequorin (PREVAGEN) 10 MG CAPS Take 1 tablet by mouth daily.     Ascorbic Acid (VITAMIN C) 1000 MG tablet Take 1,000 mg by mouth daily.     aspirin EC 81 MG tablet Take 81 mg by mouth daily.     atorvastatin (LIPITOR) 80 MG tablet TAKE 1 TABLET BY MOUTH EVERY EVENING 30 tablet 6   betamethasone dipropionate 0.05 % cream Apply 1 application topically daily.     Biotin 1000 MCG tablet Take 1,000 mcg by mouth daily.     BYDUREON BCISE 2 MG/0.85ML AUIJ Inject into the skin once a week.     Calcium Carbonate-Vitamin D (CALTRATE 600+D PO) Take 1 tablet by mouth daily.      carvedilol (COREG) 25 MG tablet Take 1 tablet (25 mg total) by mouth 2 (two) times daily. 180 tablet 3   Cholecalciferol (VITAMIN  D3) 2000 units TABS Take 1 tablet by mouth daily.     cyclobenzaprine (FLEXERIL) 10 MG tablet Take 10 mg by mouth at bedtime.     diclofenac Sodium (VOLTAREN) 1 % GEL Apply 4 g topically 4 (four) times daily.     ferrous sulfate 325 (65 FE) MG tablet Take 325 mg by mouth daily with breakfast.     folic acid (FOLVITE) 1 MG tablet Take 1 mg by mouth daily.     furosemide (LASIX) 20 MG tablet Take 1 tablet (20 mg total) by mouth daily. May take an additional 20 mg daily as needed 135 tablet 3   gabapentin (NEURONTIN) 300 MG capsule Take 1 capsule by mouth at bedtime.  2   HYDROcodone-acetaminophen (NORCO) 7.5-325 MG per tablet Take 1 tablet by mouth 2 (two) times daily as needed for moderate pain. 10-325 MG     ibuprofen (ADVIL,MOTRIN) 800 MG tablet Take 800 mg by mouth 2 (two) times daily as needed.     losartan (COZAAR) 50 MG tablet Take 1 tablet (50 mg total) by mouth daily. 30 tablet 6   magnesium gluconate (MAGONATE) 500 MG tablet Take 500 mg by mouth daily.     Multiple Vitamin (MULTIVITAMIN) tablet Take 1 tablet  by mouth daily.     multivitamin-iron-minerals-folic acid (CENTRUM) chewable tablet Chew 1 tablet by mouth daily.     nisoldipine (SULAR) 34 MG 24 hr tablet Take 1 tablet (34 mg total) by mouth daily. 30 tablet 6   nitroGLYCERIN (NITROSTAT) 0.4 MG SL tablet Place 1 tablet (0.4 mg total) under the tongue every 5 (five) minutes as needed for chest pain. 25 tablet 3   omeprazole (PRILOSEC) 20 MG capsule Take 20 mg by mouth daily.     senna (SENOKOT) 8.6 MG tablet Take 1 tablet by mouth daily as needed.     sildenafil (VIAGRA) 100 MG tablet Take 100 mg by mouth daily as needed for erectile dysfunction.     terazosin (HYTRIN) 1 MG capsule Take 1 mg by mouth at bedtime.     Tiotropium Bromide Monohydrate (SPIRIVA RESPIMAT) 2.5 MCG/ACT AERS Inhale into the lungs.     Turmeric 500 MG CAPS Take 1 tablet by mouth daily.     vitamin B-12 (CYANOCOBALAMIN) 1000 MCG tablet Take 1,000 mcg by mouth daily.     No current facility-administered medications for this visit.     Past Surgical History:  Procedure Laterality Date   APPENDECTOMY  1948   BACK SURGERY     CARDIAC CATHETERIZATION  05/1990   CARDIAC CATHETERIZATION N/A 01/06/2016   Procedure: Left Heart Cath and Coronary Angiography;  Surgeon: Troy Sine, MD;  Location: [facility];  Service: Cardiovascular;  Laterality: N/A;   CARDIAC CATHETERIZATION N/A 01/06/2016   Procedure: Coronary Stent Intervention;  Surgeon: Troy Sine, MD;  Location: [facility];  Service: Cardiovascular;  Laterality: N/A;  RCA mid   CATARACT EXTRACTION W/ INTRAOCULAR LENS  IMPLANT, BILATERAL Bilateral    POSTERIOR LUMBAR FUSION  08/1982; 05/1996     Allergies  Allergen Reactions   Metformin     Other reaction(s): Psychotic disorder Gi intolerance    Prednisone Other (See Comments)    Tablet form only per patient (paranoid).  Injection form is fine.       Family History  Problem Relation Age of Onset   Heart attack Father        MID 7'S    Cancer Father  Diabetes Mother    Stroke Mother    Diabetes Brother        X'S 2     Social History [name] reports that he quit smoking about 20 years ago. His smoking use included cigars. He has never used smokeless tobacco. Mr. Hoshino reports current alcohol use.   Review of Systems CONSTITUTIONAL: No weight loss, fever, chills, weakness or fatigue.  HEENT: Eyes: No visual loss, blurred vision, double vision or yellow sclerae.No hearing loss, sneezing, congestion, runny nose or sore throat.  SKIN: No rash or itching.  CARDIOVASCULAR: per hpi RESPIRATORY: No shortness of breath, cough or sputum.  GASTROINTESTINAL: No anorexia, nausea, vomiting or diarrhea. No abdominal pain or blood.  GENITOURINARY: No burning on urination, no polyuria NEUROLOGICAL: No headache, dizziness, syncope, paralysis, ataxia, numbness or tingling in the extremities. No change in bowel or bladder control.  MUSCULOSKELETAL: No muscle, back pain, joint pain or stiffness.  LYMPHATICS: No enlarged nodes. No history of splenectomy.  PSYCHIATRIC: No history of depression or anxiety.  ENDOCRINOLOGIC: No reports of sweating, cold or heat intolerance. No polyuria or polydipsia.  Marland Kitchen   Physical Examination Today's Vitals   06/08/22 1059  BP: 98/62  Pulse: 71  SpO2: 93%  Weight: 203 lb (92.1 kg)  Height: '5\' 6"'$  (1.676 m)   Body mass index is 32.77 kg/m.  Gen: resting comfortably, no acute distress HEENT: no scleral icterus, pupils equal round and reactive, no palptable cervical adenopathy,  CV: RRR, no mrg, no jvd Resp: Clear to auscultation bilaterally GI: abdomen is soft, non-tender, non-distended, normal bowel sounds, no hepatosplenomegaly MSK: extremities are warm, no edema.  Skin: warm, no rash Neuro:  no focal deficits Psych: appropriate affect   Diagnostic Studies [facility] Cath 05/30/1990   DIAGNOSTIC SUMMARY Coronary Artery Disease RCA system: normal Left Main: normal No significant  CAD indicated LAD system: normal Left Ventriculogram LCX system: normal Ejection Fraction: 71%  COMMENT The LAD curls the Apex. The RCA and Cirx arteries both contribute small PDA's.      07/2013 Echo   LVEF 45-50%, mild LVH, grade I diastolic dysfunction, hypokinesis of the apical anterior and apical latera walls.     12/2015 cath Mid RCA-1 lesion, 30 %stenosed. A STENT SYNERGY DES 2.75X20 drug eluting stent was successfully placed. Mid RCA-2 lesion, 80 %stenosed. Post intervention, there is a 0% residual stenosis. The left ventricular systolic function is normal. LV end diastolic pressure is normal. The left ventricular ejection fraction is 55-65% by visual estimate. There is no mitral valve regurgitation.   Normal LV function with an ejection fraction of approximately 55-60%. LVEDP 13 mm Hg.   Normal left coronary coronary circulation with a large LAD and circumflex vessel and diminutive ramus intermediate vessel.   Single vessel CAD with 30% followed by 80% mid RCA stenosis.   Successful PCI with PTCA/DES stenting of the mid RCA with ultimate insertion of a 2.7x 20 mm Synergy DES stent postdilated to 2.92 mm with the stenoses being reduced to 0%.   Postcath RECOMMENDATION: The patient will continue with dual antiplatelet and medical therapy.  Aggressive lipid intervention with a target LDL less than 70 will be undertaken.  The patient will follow up with Dr. Carlyle Dolly.     07/2016 Exercise nuclear stress Blood pressure demonstrated a hypertensive response to exercise. There was no ST segment deviation noted during stress. The study is normal. There are no perfusion defects consistent with prior infarct or current ishcemia. This is a low  risk study. The left ventricular ejection fraction is hyperdynamic (>65%).     01/2017 PFTs Mild ventilatory defect without obstruction, moderately reduced TLC, high airway resistance.       Assessment and Plan  1. CAD - no  symptoms, continue current meds   2. Chronic diastolic HF - no symptoms and euvolemic today, continue diuretics  3. HTN   - at goal, continue current meds   4. Hyperlipidemia -at goal, continue current meds  F/u 6 months      Arnoldo Lenis, M.D.

## 2022-06-08 NOTE — Patient Instructions (Signed)
Medication Instructions:  Continue all current medications.   Labwork: none  Testing/Procedures: none  Follow-Up: 6 months   Any Other Special Instructions Will Be Listed Below (If Applicable).   If you need a refill on your cardiac medications before your next appointment, please call your pharmacy.  

## 2022-09-10 ENCOUNTER — Other Ambulatory Visit: Payer: Self-pay | Admitting: Cardiology

## 2022-12-07 ENCOUNTER — Other Ambulatory Visit: Payer: Self-pay | Admitting: Cardiology

## 2022-12-11 ENCOUNTER — Ambulatory Visit: Payer: Medicare Other | Admitting: Cardiology

## 2023-01-04 ENCOUNTER — Ambulatory Visit: Payer: Medicare Other | Admitting: Cardiology

## 2023-01-30 ENCOUNTER — Ambulatory Visit (INDEPENDENT_AMBULATORY_CARE_PROVIDER_SITE_OTHER): Payer: Medicare Other | Admitting: Pulmonary Disease

## 2023-01-30 ENCOUNTER — Encounter: Payer: Self-pay | Admitting: Pulmonary Disease

## 2023-01-30 VITALS — BP 133/74 | HR 70 | Ht 66.0 in | Wt 197.0 lb

## 2023-01-30 DIAGNOSIS — K1120 Sialoadenitis, unspecified: Secondary | ICD-10-CM | POA: Insufficient documentation

## 2023-01-30 DIAGNOSIS — G4733 Obstructive sleep apnea (adult) (pediatric): Secondary | ICD-10-CM

## 2023-01-30 DIAGNOSIS — F5101 Primary insomnia: Secondary | ICD-10-CM | POA: Insufficient documentation

## 2023-01-30 DIAGNOSIS — I5032 Chronic diastolic (congestive) heart failure: Secondary | ICD-10-CM | POA: Insufficient documentation

## 2023-01-30 DIAGNOSIS — R5383 Other fatigue: Secondary | ICD-10-CM | POA: Insufficient documentation

## 2023-01-30 DIAGNOSIS — J4520 Mild intermittent asthma, uncomplicated: Secondary | ICD-10-CM

## 2023-01-30 DIAGNOSIS — F3342 Major depressive disorder, recurrent, in full remission: Secondary | ICD-10-CM | POA: Insufficient documentation

## 2023-01-30 DIAGNOSIS — I252 Old myocardial infarction: Secondary | ICD-10-CM | POA: Insufficient documentation

## 2023-01-30 DIAGNOSIS — K589 Irritable bowel syndrome without diarrhea: Secondary | ICD-10-CM | POA: Insufficient documentation

## 2023-01-30 DIAGNOSIS — F4312 Post-traumatic stress disorder, chronic: Secondary | ICD-10-CM | POA: Insufficient documentation

## 2023-01-30 DIAGNOSIS — H00019 Hordeolum externum unspecified eye, unspecified eyelid: Secondary | ICD-10-CM | POA: Insufficient documentation

## 2023-01-30 DIAGNOSIS — E1165 Type 2 diabetes mellitus with hyperglycemia: Secondary | ICD-10-CM | POA: Insufficient documentation

## 2023-01-30 MED ORDER — ALBUTEROL SULFATE HFA 108 (90 BASE) MCG/ACT IN AERS: 2.0000 | INHALATION_SPRAY | RESPIRATORY_TRACT | 3 refills | Status: DC | PRN

## 2023-01-30 NOTE — Patient Instructions (Addendum)
X Refills on albuterol MDI

## 2023-01-30 NOTE — Assessment & Plan Note (Signed)
Continue CPAP at current settings. He is compliant by report. Weight loss encouraged, compliance with goal of at least 4-6 hrs every night is the expectation. Advised against medications with sedative side effects Cautioned against driving when sleepy - understanding that sleepiness will vary on a day to day basis

## 2023-01-30 NOTE — Assessment & Plan Note (Signed)
Mild intermittent symptoms. Refills will be provided on albuterol We discussed signs and symptoms of exacerbation

## 2023-01-30 NOTE — Progress Notes (Signed)
   Subjective:    Patient ID: Edwin Monroe, male    DOB: 07-30-1945, 77 y.o.   MRN: [id number]  HPI  77 yo never smoker for FU of mild persistent asthma and OSA.   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   PMH -colon cancer, CAD status post stent to RCA 2017  Annual follow-up visit He has lost 13 pounds over the past year.  He is on exenatide injections.  This has helped his breathing and his breathing is much improved. He only needs albuterol MDI twice a week. For some reason he was prescribed Spiriva at the [place] but he admits to not taking this He is compliant with 20 mg of Lasix daily He has a CPAP machine and he is compliant with this every night.  This was provided by the [place].  He denies any problems with mask or pressure  Significant tests/ events reviewed   [facility] ([facility]  12/2012 -214 lbs - inadequate titration bipap 14/10 tried, bipap download 2015 17/13 >> reisdual AHI 11/h, mainly obstructive   PFTs 01/2017 -ratio of 83, FEV1 84%, FVC 76%, TLC 60%, DLCO 66% but corrects to 97% for alveolar volume >> moderate extraparenchymal restriction consistent with obesity  Review of Systems neg for any significant sore throat, dysphagia, itching, sneezing, nasal congestion or excess/ purulent secretions, fever, chills, sweats, unintended wt loss, pleuritic or exertional cp, hempoptysis, orthopnea pnd or change in chronic leg swelling. Also denies presyncope, palpitations, heartburn, abdominal pain, nausea, vomiting, diarrhea or change in bowel or urinary habits, dysuria,hematuria, rash, arthralgias, visual complaints, headache, numbness weakness or ataxia.     Objective:   Physical Exam  Gen. Pleasant, obese, in no distress ENT - no lesions, no post nasal drip Neck: No JVD, no thyromegaly, no carotid bruits Lungs: no use of accessory muscles, no dullness to  percussion, decreased without rales or rhonchi  Cardiovascular: Rhythm regular, heart sounds  normal, no murmurs or gallops, no peripheral edema Musculoskeletal: No deformities, no cyanosis or clubbing , no tremors       Assessment & Plan:

## 2023-03-27 ENCOUNTER — Ambulatory Visit: Payer: Medicare Other | Admitting: Pulmonary Disease

## 2023-04-19 ENCOUNTER — Encounter: Payer: Self-pay | Admitting: Cardiology

## 2023-04-19 ENCOUNTER — Ambulatory Visit: Payer: Medicare Other | Attending: Cardiology | Admitting: Cardiology

## 2023-04-19 VITALS — BP 130/70 | HR 64 | Ht 63.0 in | Wt 205.2 lb

## 2023-04-19 DIAGNOSIS — I10 Essential (primary) hypertension: Secondary | ICD-10-CM | POA: Diagnosis not present

## 2023-04-19 DIAGNOSIS — I5032 Chronic diastolic (congestive) heart failure: Secondary | ICD-10-CM

## 2023-04-19 DIAGNOSIS — E782 Mixed hyperlipidemia: Secondary | ICD-10-CM

## 2023-04-19 DIAGNOSIS — I2510 Atherosclerotic heart disease of native coronary artery without angina pectoris: Secondary | ICD-10-CM | POA: Diagnosis not present

## 2023-04-19 NOTE — Progress Notes (Signed)
Clinical Summary Edwin Monroe is a 77 y.o.male seen today for follow up of the following medical problems.     1. CAD - notes indicate remote history of MI in early 1s in [place] at [place]. Transferred to [facility] at that time,cath at that time showed patent coronaries  -  Received DES to RCA 12/2015. LVEF by LVgram at that time 55-65% - 07/2016 exercise nuclear stress: no ischemia   - no chest pains, no SOB/DOE - compliant with meds    2. HTN   -compliant with meds     3. OSA   - [facility]     4. Hyperlipidemia   02/2021 TC 112 TG 156 HDL 39 LDL 41 - he is on a statin - more recent labs at [facility]   03/2022 TC 93 HDL 36 TG 100 LDL 37  03/2023 TC 101 TG 99 HDL 38 LDL 43   5. DM2 - followed by pcp 03/2022 HgbA1c 6   6. SOB - abnormal PFTs as described below. Notes mention persistent asthma  - followed by pulmonary Dr Vassie Loll  - no recent SOB   7. Colon cancer - prior surgery in [place], [place]     8. AAA screen - Jan 2015 CT abd no AAA reported Past Medical History:  Diagnosis Date   Agent orange exposure [phone]   Anxiety disorder    Arthritis    "back, knees, shoulders" (01/06/2016)   Asthma    CAD (coronary artery disease)    a. remote MI 1991 with OK cath at that time. b. LHC 01/06/16:  PTCA/DES to mRCA, left coronary system OK, LVEF 55-65%.    Cancer, colon (HCC)    Cardiomyopathy (HCC)    a. EF 45-50% in 2015. b. LVEF 55-65% in 2017.   Chronic lower back pain    GERD (gastroesophageal reflux disease)    Hypertension    Hypothyroidism    "got nuclear treatment in the 70's"   Myocardial infarction (HCC) 04/1990    in [place] at [place]/notes 08/21/2013   OSA on CPAP    Prediabetes      Allergies  Allergen Reactions   Metformin     Other reaction(s): Psychotic disorder Gi intolerance    Prednisone Other (See Comments)    Tablet form only per patient (paranoid).  Injection form is fine.       Current Outpatient Medications  Medication Sig Dispense Refill   albuterol (VENTOLIN HFA) 108 (90 Base) MCG/ACT inhaler Inhale 2 puffs into the lungs as needed. 18 g 3   Apoaequorin (PREVAGEN) 10 MG CAPS Take 1 tablet by mouth daily.     Ascorbic Acid (VITAMIN C) 1000 MG tablet Take 1,000 mg by mouth daily.     aspirin EC 81 MG tablet Take 81 mg by mouth daily.     atorvastatin (LIPITOR) 80 MG tablet TAKE 1 TABLET BY MOUTH EVERY EVENING 30 tablet 6   betamethasone dipropionate 0.05 % cream Apply 1 application topically daily.     Biotin 1000 MCG tablet Take 1,000 mcg by mouth daily.     BYDUREON BCISE 2 MG/0.85ML AUIJ Inject into the skin once a week.     Calcium Carbonate-Vitamin D (CALTRATE 600+D PO) Take 1 tablet by mouth daily.      carvedilol (COREG) 25 MG tablet Take 1 tablet (25 mg total) by mouth 2 (two) times daily. 180 tablet 3   celecoxib (CELEBREX) 100 MG  capsule Take 100 mg by mouth 2 (two) times daily.     Cholecalciferol (VITAMIN D3) 2000 units TABS Take 1 tablet by mouth daily.     cyclobenzaprine (FLEXERIL) 10 MG tablet Take 10 mg by mouth at bedtime.     diclofenac Sodium (VOLTAREN) 1 % GEL Apply 4 g topically 4 (four) times daily.     ferrous sulfate 325 (65 FE) MG tablet Take 325 mg by mouth daily with breakfast.     folic acid (FOLVITE) 1 MG tablet Take 1 mg by mouth daily.     furosemide (LASIX) 20 MG tablet TAKE 1 TABLET (20 MG TOTAL) BY MOUTH DAILY. MAY TAKE AN ADDITIONAL 20 MG DAILY AS NEEDED 135 tablet 1   gabapentin (NEURONTIN) 300 MG capsule Take 1 capsule by mouth at bedtime.  2   HYDROcodone-acetaminophen (NORCO) 7.5-325 MG per tablet Take 1 tablet by mouth 2 (two) times daily as needed for moderate pain. 10-325 MG     losartan (COZAAR) 50 MG tablet Take 1 tablet (50 mg total) by mouth daily. 30 tablet 6   magnesium gluconate (MAGONATE) 500 MG tablet Take 500 mg by mouth daily.     Multiple Vitamin (MULTIVITAMIN) tablet Take 1 tablet by mouth daily.      multivitamin-iron-minerals-folic acid (CENTRUM) chewable tablet Chew 1 tablet by mouth daily.     naloxone (NARCAN) nasal spray 4 mg/0.1 mL USE 1 SPRAY ONE NOSTRIL ONE TIME AS NEEDED (EMERGENCY USE ONLY). MAY REPEAT DOSE IN OTHER NOSTRIL ONCE IN 2 TO 3 MINUTES OR IF STILL NOT BREATHING.  PLEASE NOTIFY THE CLINIC NURSE IF THIS IS EVER USED (EMERGENCY USE ONLY). MAY REPEAT DOSE IN OTHER NOSTRIL ONCE IN 2 TO 3 MINUTES OR IF STILL NOT BREATHING.  PLEASE NOTIFY THE CLINIC NURSE IF THIS IS EVER USED     nisoldipine (SULAR) 34 MG 24 hr tablet Take 1 tablet (34 mg total) by mouth daily. 30 tablet 6   nitroGLYCERIN (NITROSTAT) 0.4 MG SL tablet Place 1 tablet (0.4 mg total) under the tongue every 5 (five) minutes as needed for chest pain. 25 tablet 3   omeprazole (PRILOSEC) 20 MG capsule Take 20 mg by mouth daily.     penicillin v potassium (VEETID) 500 MG tablet Take 500 mg by mouth as needed.     senna (SENOKOT) 8.6 MG tablet Take 1 tablet by mouth daily as needed.     sildenafil (VIAGRA) 100 MG tablet Take 100 mg by mouth daily as needed for erectile dysfunction.     terazosin (HYTRIN) 1 MG capsule Take 1 mg by mouth at bedtime.     Tiotropium Bromide Monohydrate (SPIRIVA RESPIMAT) 2.5 MCG/ACT AERS Inhale into the lungs.     Turmeric 500 MG CAPS Take 1 tablet by mouth daily.     vitamin B-12 (CYANOCOBALAMIN) 1000 MCG tablet Take 1,000 mcg by mouth daily.     No current facility-administered medications for this visit.     Past Surgical History:  Procedure Laterality Date   APPENDECTOMY  1948   BACK SURGERY     CARDIAC CATHETERIZATION  05/1990   CARDIAC CATHETERIZATION N/A 01/06/2016   Procedure: Left Heart Cath and Coronary Angiography;  Surgeon: Lennette Bihari, MD;  Location: [facility] INVASIVE CV LAB;  Service: Cardiovascular;  Laterality: N/A;   CARDIAC CATHETERIZATION N/A 01/06/2016   Procedure: Coronary Stent Intervention;  Surgeon: Lennette Bihari, MD;  Location: [facility] INVASIVE CV LAB;  Service:  Cardiovascular;  Laterality: N/A;  RCA mid  CATARACT EXTRACTION W/ INTRAOCULAR LENS  IMPLANT, BILATERAL Bilateral    POSTERIOR LUMBAR FUSION  08/1982; 05/1996     Allergies  Allergen Reactions   Metformin     Other reaction(s): Psychotic disorder Gi intolerance    Prednisone Other (See Comments)    Tablet form only per patient (paranoid).  Injection form is fine.       Family History  Problem Relation Age of Onset   Heart attack Father        MID 48'S   Cancer Father    Diabetes Mother    Stroke Mother    Diabetes Brother        X'S 2     Social History Mr. Digirolamo reports that he quit smoking about 20 years ago. His smoking use included cigars. He has never used smokeless tobacco. Mr. Burandt reports current alcohol use.   Review of Systems CONSTITUTIONAL: No weight loss, fever, chills, weakness or fatigue.  HEENT: Eyes: No visual loss, blurred vision, double vision or yellow sclerae.No hearing loss, sneezing, congestion, runny nose or sore throat.  SKIN: No rash or itching.  CARDIOVASCULAR: per hpi RESPIRATORY: No shortness of breath, cough or sputum.  GASTROINTESTINAL: No anorexia, nausea, vomiting or diarrhea. No abdominal pain or blood.  GENITOURINARY: No burning on urination, no polyuria NEUROLOGICAL: No headache, dizziness, syncope, paralysis, ataxia, numbness or tingling in the extremities. No change in bowel or bladder control.  MUSCULOSKELETAL: No muscle, back pain, joint pain or stiffness.  LYMPHATICS: No enlarged nodes. No history of splenectomy.  PSYCHIATRIC: No history of depression or anxiety.  ENDOCRINOLOGIC: No reports of sweating, cold or heat intolerance. No polyuria or polydipsia.  Marland Kitchen   Physical Examination Today's Vitals   04/19/23 1334  BP: 130/70  Pulse: 64  SpO2: 97%  Weight: 205 lb 3.2 oz (93.1 kg)  Height: 5\' 3"  (1.6 m)   Body mass index is 36.35 kg/m.  Gen: resting comfortably, no acute distress HEENT: no scleral icterus,  pupils equal round and reactive, no palptable cervical adenopathy,  CV: RRR, no m/rg, no jvd Resp: Clear to auscultation bilaterally GI: abdomen is soft, non-tender, non-distended, normal bowel sounds, no hepatosplenomegaly MSK: extremities are warm, no edema.  Skin: warm, no rash Neuro:  no focal deficits Psych: appropriate affect   Diagnostic Studies  [facility] Cath 05/30/1990   DIAGNOSTIC SUMMARY Coronary Artery Disease RCA system: normal Left Main: normal No significant CAD indicated LAD system: normal Left Ventriculogram LCX system: normal Ejection Fraction: 71%  COMMENT The LAD curls the Apex. The RCA and Cirx arteries both contribute small PDA's.      07/2013 Echo   LVEF 45-50%, mild LVH, grade I diastolic dysfunction, hypokinesis of the apical anterior and apical latera walls.     12/2015 cath Mid RCA-1 lesion, 30 %stenosed. A STENT SYNERGY DES 2.75X20 drug eluting stent was successfully placed. Mid RCA-2 lesion, 80 %stenosed. Post intervention, there is a 0% residual stenosis. The left ventricular systolic function is normal. LV end diastolic pressure is normal. The left ventricular ejection fraction is 55-65% by visual estimate. There is no mitral valve regurgitation.   Normal LV function with an ejection fraction of approximately 55-60%. LVEDP 13 mm Hg.   Normal left coronary coronary circulation with a large LAD and circumflex vessel and diminutive ramus intermediate vessel.   Single vessel CAD with 30% followed by 80% mid RCA stenosis.   Successful PCI with PTCA/DES stenting of the mid RCA with ultimate insertion of a 2.7x 20  mm Synergy DES stent postdilated to 2.92 mm with the stenoses being reduced to 0%.   Postcath RECOMMENDATION: The patient will continue with dual antiplatelet and medical therapy.  Aggressive lipid intervention with a target LDL less than 70 will be undertaken.  The patient will follow up with Dr. Dina Rich.     07/2016 Exercise nuclear  stress Blood pressure demonstrated a hypertensive response to exercise. There was no ST segment deviation noted during stress. The study is normal. There are no perfusion defects consistent with prior infarct or current ishcemia. This is a low risk study. The left ventricular ejection fraction is hyperdynamic (>65%).     01/2017 PFTs Mild ventilatory defect without obstruction, moderately reduced TLC, high airway resistance.        Assessment and Plan   1. CAD - denies any symptoms, continue current meds - EKG today shows SR, no acute ischemic chagnes   2. Chronic diastolic HF - euvoelmic without symptoms, continue current meds   3. HTN   - bp is at goal, continue current meds   4. Hyperlipidemia -lipids are at goal, continue current meds     Antoine Poche, M.D.

## 2023-04-19 NOTE — Patient Instructions (Signed)
Medication Instructions:  Continue all current medications.   Labwork: none  Testing/Procedures: none  Follow-Up: 6 months   Any Other Special Instructions Will Be Listed Below (If Applicable).   If you need a refill on your cardiac medications before your next appointment, please call your pharmacy.  

## 2023-06-02 ENCOUNTER — Other Ambulatory Visit: Payer: Self-pay | Admitting: Cardiology

## 2023-06-29 ENCOUNTER — Other Ambulatory Visit: Payer: Self-pay | Admitting: Pulmonary Disease

## 2023-10-25 ENCOUNTER — Ambulatory Visit: Payer: Medicare Other | Attending: Cardiology | Admitting: Cardiology

## 2023-10-25 ENCOUNTER — Encounter: Payer: Self-pay | Admitting: Cardiology

## 2023-10-25 VITALS — BP 132/74 | HR 70 | Ht 66.5 in | Wt 201.6 lb

## 2023-10-25 DIAGNOSIS — I10 Essential (primary) hypertension: Secondary | ICD-10-CM | POA: Diagnosis not present

## 2023-10-25 DIAGNOSIS — I2510 Atherosclerotic heart disease of native coronary artery without angina pectoris: Secondary | ICD-10-CM | POA: Diagnosis present

## 2023-10-25 DIAGNOSIS — E782 Mixed hyperlipidemia: Secondary | ICD-10-CM | POA: Insufficient documentation

## 2023-10-25 NOTE — Progress Notes (Signed)
 Clinical Summary Mr. Creppel is a 78 y.o.male seen today for follow up of the following medical problems.     1. CAD - notes indicate remote history of MI in early 71s in [place] at [place]. Transferred to [facility] at that time,cath at that time showed patent coronaries  -  Received DES to RCA 12/2015. LVEF by LVgram at that time 55-65% - 07/2016 exercise nuclear stress: no ischemia   - no chest pains, no SOB/DOE     2. HTN   -compliant with meds     3. OSA   - poor [facility]     4. Hyperlipidemia   02/2021 TC 112 TG 156 HDL 39 LDL 41 - he is on a statin - more recent labs at [facility]   03/2022 TC 93 HDL 36 TG 100 LDL 37  03/2023 TC 101 TG 99 HDL 38 LDL 43   5. DM2 - followed by pcp 03/2022 HgbA1c 6   6. SOB - abnormal PFTs as described below. Notes mention persistent asthma  - followed by pulmonary Dr Villa Greaser   - no recent SOB   7. Colon cancer - prior surgery in [place], [place]     8. AAA screen - Jan 2015 CT abd no AAA reported Past Medical History:  Diagnosis Date   Agent orange exposure [id number]   Anxiety disorder    Arthritis    "back, knees, shoulders" (01/06/2016)   Asthma    CAD (coronary artery disease)    a. remote MI 1991 with OK cath at that time. b. LHC 01/06/16:  PTCA/DES to mRCA, left coronary system OK, LVEF 55-65%.    Cancer, colon (HCC)    Cardiomyopathy (HCC)    a. EF 45-50% in 2015. b. LVEF 55-65% in 2017.   Chronic lower back pain    GERD (gastroesophageal reflux disease)    Hypertension    Hypothyroidism    "got nuclear treatment in the 70's"   Myocardial infarction (HCC) 04/1990    in [place] at [place]/notes 08/21/2013   OSA on CPAP    Prediabetes      Allergies  Allergen Reactions   Metformin     Other reaction(s): Psychotic disorder Gi intolerance    Prednisone Other (See Comments)    Tablet form only per patient (paranoid).  Injection form is fine.      Current Outpatient  Medications  Medication Sig Dispense Refill   albuterol  (VENTOLIN  HFA) 108 (90 Base) MCG/ACT inhaler INHALE 2 PUFFS INTO THE LUNGS AS NEEDED. 18 each 3   Apoaequorin (PREVAGEN) 10 MG CAPS Take 1 tablet by mouth daily.     Ascorbic Acid (VITAMIN C) 1000 MG tablet Take 1,000 mg by mouth daily.     aspirin  EC 81 MG tablet Take 81 mg by mouth daily.     atorvastatin  (LIPITOR) 80 MG tablet TAKE 1 TABLET BY MOUTH EVERY EVENING 30 tablet 6   betamethasone dipropionate 0.05 % cream Apply 1 application topically daily.     Biotin 1000 MCG tablet Take 1,000 mcg by mouth daily.     BYDUREON BCISE 2 MG/0.85ML AUIJ Inject into the skin once a week.     Calcium  Carbonate-Vitamin D (CALTRATE 600+D PO) Take 1 tablet by mouth daily.      carvedilol  (COREG ) 25 MG tablet Take 1 tablet (25 mg total) by mouth 2 (two) times daily. 180 tablet 3   celecoxib (CELEBREX) 100 MG capsule Take  100 mg by mouth 2 (two) times daily.     Cholecalciferol (VITAMIN D3) 2000 units TABS Take 1 tablet by mouth daily.     cyclobenzaprine  (FLEXERIL ) 10 MG tablet Take 10 mg by mouth at bedtime. (Patient not taking: Reported on 04/19/2023)     diclofenac Sodium (VOLTAREN) 1 % GEL Apply 4 g topically 4 (four) times daily.     ferrous sulfate 325 (65 FE) MG tablet Take 325 mg by mouth daily with breakfast.     folic acid (FOLVITE) 1 MG tablet Take 1 mg by mouth daily.     furosemide  (LASIX ) 20 MG tablet TAKE 1 TABLET (20 MG TOTAL) BY MOUTH DAILY. MAY TAKE AN ADDITIONAL 20 MG DAILY AS NEEDED 135 tablet 3   gabapentin  (NEURONTIN ) 300 MG capsule Take 1 capsule by mouth at bedtime.  2   HYDROcodone-acetaminophen  (NORCO) 7.5-325 MG per tablet Take 1 tablet by mouth 2 (two) times daily as needed for moderate pain. 10-325 MG     losartan  (COZAAR ) 50 MG tablet Take 1 tablet (50 mg total) by mouth daily. 30 tablet 6   magnesium gluconate (MAGONATE) 500 MG tablet Take 500 mg by mouth daily.     methocarbamol (ROBAXIN) 750 MG tablet Take 750 mg by  mouth 3 (three) times daily.     Multiple Vitamin (MULTIVITAMIN) tablet Take 1 tablet by mouth daily.     multivitamin-iron-minerals-folic acid (CENTRUM) chewable tablet Chew 1 tablet by mouth daily.     naloxone (NARCAN) nasal spray 4 mg/0.1 mL USE 1 SPRAY ONE NOSTRIL ONE TIME AS NEEDED (EMERGENCY USE ONLY). MAY REPEAT DOSE IN OTHER NOSTRIL ONCE IN 2 TO 3 MINUTES OR IF STILL NOT BREATHING.  PLEASE NOTIFY THE CLINIC NURSE IF THIS IS EVER USED (EMERGENCY USE ONLY). MAY REPEAT DOSE IN OTHER NOSTRIL ONCE IN 2 TO 3 MINUTES OR IF STILL NOT BREATHING.  PLEASE NOTIFY THE CLINIC NURSE IF THIS IS EVER USED     nisoldipine  (SULAR ) 34 MG 24 hr tablet Take 1 tablet (34 mg total) by mouth daily. 30 tablet 6   nitroGLYCERIN  (NITROSTAT ) 0.4 MG SL tablet Place 1 tablet (0.4 mg total) under the tongue every 5 (five) minutes as needed for chest pain. 25 tablet 3   omeprazole (PRILOSEC) 20 MG capsule Take 20 mg by mouth daily.     penicillin v potassium (VEETID) 500 MG tablet Take 500 mg by mouth as needed.     senna (SENOKOT) 8.6 MG tablet Take 1 tablet by mouth daily as needed.     sildenafil (VIAGRA) 100 MG tablet Take 100 mg by mouth daily as needed for erectile dysfunction.     terazosin  (HYTRIN ) 1 MG capsule Take 1 mg by mouth at bedtime.     Tiotropium Bromide Monohydrate (SPIRIVA RESPIMAT) 2.5 MCG/ACT AERS Inhale into the lungs.     Turmeric 500 MG CAPS Take 1 tablet by mouth daily. (Patient not taking: Reported on 04/19/2023)     vitamin B-12 (CYANOCOBALAMIN) 1000 MCG tablet Take 1,000 mcg by mouth daily.     No current facility-administered medications for this visit.     Past Surgical History:  Procedure Laterality Date   APPENDECTOMY  1948   BACK SURGERY     CARDIAC CATHETERIZATION  05/1990   CARDIAC CATHETERIZATION N/A 01/06/2016   Procedure: Left Heart Cath and Coronary Angiography;  Surgeon: Millicent Ally, MD;  Location: [facility] INVASIVE CV LAB;  Service: Cardiovascular;  Laterality: N/A;   CARDIAC  CATHETERIZATION N/A  01/06/2016   Procedure: Coronary Stent Intervention;  Surgeon: Millicent Ally, MD;  Location: [facility] INVASIVE CV LAB;  Service: Cardiovascular;  Laterality: N/A;  RCA mid   CATARACT EXTRACTION W/ INTRAOCULAR LENS  IMPLANT, BILATERAL Bilateral    POSTERIOR LUMBAR FUSION  08/1982; 05/1996     Allergies  Allergen Reactions   Metformin     Other reaction(s): Psychotic disorder Gi intolerance    Prednisone Other (See Comments)    Tablet form only per patient (paranoid).  Injection form is fine.       Family History  Problem Relation Age of Onset   Heart attack Father        MID 46'S   Cancer Father    Diabetes Mother    Stroke Mother    Diabetes Brother        X'S 2     Social History Mr. Diodato reports that he quit smoking about 21 years ago. His smoking use included cigars. He has never used smokeless tobacco. Mr. Brockbank reports current alcohol use.    Physical Examination Today's Vitals   10/25/23 1256  BP: 132/74  Pulse: 70  SpO2: 96%  Weight: 201 lb 9.6 oz (91.4 kg)  Height: 5' 6.5" (1.689 m)   Body mass index is 32.05 kg/m.  Gen: resting comfortably, no acute distress HEENT: no scleral icterus, pupils equal round and reactive, no palptable cervical adenopathy,  CV: RRR, no m/rg, no jvd Resp: Clear to auscultation bilaterally GI: abdomen is soft, non-tender, non-distended, normal bowel sounds, no hepatosplenomegaly MSK: extremities are warm, no edema.  Skin: warm, no rash Neuro:  no focal deficits Psych: appropriate affect   Diagnostic Studies  [facility] Cath 05/30/1990   DIAGNOSTIC SUMMARY Coronary Artery Disease RCA system: normal Left Main: normal No significant CAD indicated LAD system: normal Left Ventriculogram LCX system: normal Ejection Fraction: 71%  COMMENT The LAD curls the Apex. The RCA and Cirx arteries both contribute small PDA's.      07/2013 Echo   LVEF 45-50%, mild LVH, grade I diastolic dysfunction, hypokinesis of  the apical anterior and apical latera walls.     12/2015 cath Mid RCA-1 lesion, 30 %stenosed. A STENT SYNERGY DES 2.75X20 drug eluting stent was successfully placed. Mid RCA-2 lesion, 80 %stenosed. Post intervention, there is a 0% residual stenosis. The left ventricular systolic function is normal. LV end diastolic pressure is normal. The left ventricular ejection fraction is 55-65% by visual estimate. There is no mitral valve regurgitation.   Normal LV function with an ejection fraction of approximately 55-60%. LVEDP 13 mm Hg.   Normal left coronary coronary circulation with a large LAD and circumflex vessel and diminutive ramus intermediate vessel.   Single vessel CAD with 30% followed by 80% mid RCA stenosis.   Successful PCI with PTCA/DES stenting of the mid RCA with ultimate insertion of a 2.7x 20 mm Synergy DES stent postdilated to 2.92 mm with the stenoses being reduced to 0%.   Postcath RECOMMENDATION: The patient will continue with dual antiplatelet and medical therapy.  Aggressive lipid intervention with a target LDL less than 70 will be undertaken.  The patient will follow up with Dr. Armida Lander.     07/2016 Exercise nuclear stress Blood pressure demonstrated a hypertensive response to exercise. There was no ST segment deviation noted during stress. The study is normal. There are no perfusion defects consistent with prior infarct or current ishcemia. This is a low risk study. The left ventricular ejection fraction is hyperdynamic (>  65%).     01/2017 PFTs Mild ventilatory defect without obstruction, moderately reduced TLC, high airway resistance.          Assessment and Plan  1. CAD -no symptoms, continue current meds     2. HTN   - at goal, continue current meds   3. Hyperlipidemia -has been at goal, continue current meds  F/u 1 year     Laurann Pollock, M.D.

## 2023-10-25 NOTE — Patient Instructions (Signed)

## 2023-12-26 ENCOUNTER — Telehealth: Payer: Self-pay | Admitting: Pulmonary Disease

## 2023-12-26 NOTE — Telephone Encounter (Signed)
 Copied from CRM [id number]. Topic: Appointments - Scheduling Inquiry for Clinic >> Dec 25, 2023 12:07 PM Russell PARAS wrote: Reason for CRM:   Pt contacted clinic to schedule 1 yr FU w/[facility] for OSA and mild intermittent asthma. Advised he is now at [facility] location, pt would rather keep care at [facility] due to being closer to his home. When attempting to schedule Transfer of Care appt, attempted to pull up schedules for new providers [facility] and [facility] and they would not populate.  Pt requests call back  CB#  [phone] TOC request sent to Dr. Jude

## 2023-12-26 NOTE — Telephone Encounter (Signed)
 Patient requests for TOC to [facility]---locations is closer to home

## 2024-01-31 ENCOUNTER — Ambulatory Visit: Admitting: Pulmonary Disease

## 2024-01-31 ENCOUNTER — Encounter: Payer: Self-pay | Admitting: Pulmonary Disease

## 2024-01-31 VITALS — BP 114/68 | HR 77 | Ht 66.0 in | Wt 193.4 lb

## 2024-01-31 DIAGNOSIS — J4520 Mild intermittent asthma, uncomplicated: Secondary | ICD-10-CM

## 2024-01-31 DIAGNOSIS — G4733 Obstructive sleep apnea (adult) (pediatric): Secondary | ICD-10-CM

## 2024-01-31 DIAGNOSIS — Z87891 Personal history of nicotine dependence: Secondary | ICD-10-CM | POA: Diagnosis not present

## 2024-01-31 MED ORDER — ALBUTEROL SULFATE HFA 108 (90 BASE) MCG/ACT IN AERS: 2.0000 | INHALATION_SPRAY | Freq: Four times a day (QID) | RESPIRATORY_TRACT | 6 refills | Status: AC | PRN

## 2024-01-31 NOTE — Assessment & Plan Note (Signed)
 ACT 24 indicating very good control. The patient is currently on PRN Albuterol  MDI as needed. He uses it infrequently. We will continue with current plan.

## 2024-01-31 NOTE — Progress Notes (Signed)
 Established Patient Pulmonology Office Visit   Subjective:  Patient ID: Edwin Monroe, male    DOB: [birth year]  MRN: [id number]  CC:  Chief Complaint  Patient presents with   Sleep Apnea    Pt can not lay on back is now non complainant     HPI  Edwin Monroe is a 78 y/o M with a PMH significant mild intermittent asthma and OSA on BiPAP who is here for follow up.  Have not used CPAP machine for several months. He has a lot of back pain. Not using it because of back pain. Sleeping in a recliner. Having back problems for DJD. Rods in spine. Not sure of fractures in his spine. PSG in 2014 reviewed and shows moderate OSA. The patient had a weight loss of around 25 lbs in the last two years and notes improvement in OSA symptoms. He denies having any excessive daytime somnolence, fatigue, napping frequently, snoring, or wittnessed apneas. 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 He does have agent orange exposure. CPAP is provided by [facility] and he does not have any co-insurance amount. He also has hx of mild intermittent asthma and is using albuterol  MDI as needed. He uses it on average 2 times per week. He is requesting a refill. ACT is 24. ESS today is 1 compared to 5 before.  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 a theatre or a meeting) 0  As a passenger in a car for an hour without a break 0  Lying down to rest in the afternoon when circumstances permit 2  Sitting and talking to someone 0  Sitting quietly after a lunch without alcohol 1  In a car, while stopped for a few  minutes in traffic 0  Total score 5    ROS  Past Medical History:  Diagnosis Date   Agent orange exposure [phone]   Anxiety disorder    Arthritis    back, knees, shoulders (01/06/2016)   Asthma    CAD (coronary artery disease)    a. remote MI 1991 with OK cath at that time. b. LHC 01/06/16:  PTCA/DES to mRCA, left coronary system OK, LVEF 55-65%.    Cancer, colon (HCC)    Cardiomyopathy (HCC)    a. EF 45-50% in 2015. b. LVEF 55-65% in 2017.   Chronic lower back pain    GERD (gastroesophageal reflux disease)    Hypertension    Hypothyroidism    got nuclear treatment in the 70's   Myocardial infarction (HCC) 04/1990    in [place] at [place]/notes 08/21/2013   OSA on CPAP    Prediabetes    Social History   Tobacco Use   Smoking status: Former    Types: Cigars    Quit date: 05/29/2002    Years since quitting: 21.6   Smokeless tobacco: Never   Tobacco comments:    NEVER SMOKED CIGARETTES  Vaping Use   Vaping status: Never Used  Substance Use Topics   Alcohol use: Yes  Alcohol/week: 0.0 standard drinks of alcohol    Comment: 01/06/2016 stopped drinking in 2004   Drug use: No   Allergies  Allergen Reactions   Metformin     Other reaction(s): Psychotic disorder Gi intolerance    Prednisone Other (See Comments)    Tablet form only per patient (paranoid).  Injection form is fine.       Current Outpatient Medications:    albuterol  (VENTOLIN  HFA) 108 (90 Base) MCG/ACT inhaler, INHALE 2 PUFFS INTO THE LUNGS AS NEEDED., Disp: 18 each, Rfl: 3   albuterol  (VENTOLIN  HFA) 108 (90 Base) MCG/ACT inhaler, Inhale 2 puffs into the lungs every 6 (six) hours as needed for wheezing or shortness of breath., Disp: 8 g, Rfl: 6   Apoaequorin (PREVAGEN) 10 MG CAPS, Take 1 tablet by mouth daily., Disp: , Rfl:    Ascorbic Acid (VITAMIN C) 1000 MG tablet, Take 1,000 mg by mouth daily., Disp: , Rfl:    aspirin  EC 81 MG tablet, Take 81 mg by mouth daily., Disp: , Rfl:    atorvastatin   (LIPITOR) 80 MG tablet, TAKE 1 TABLET BY MOUTH EVERY EVENING, Disp: 30 tablet, Rfl: 6   betamethasone dipropionate 0.05 % cream, Apply 1 application topically daily., Disp: , Rfl:    Biotin 1000 MCG tablet, Take 1,000 mcg by mouth daily., Disp: , Rfl:    Calcium  Carbonate-Vitamin D (CALTRATE 600+D PO), Take 1 tablet by mouth daily. , Disp: , Rfl:    carvedilol  (COREG ) 25 MG tablet, Take 1 tablet (25 mg total) by mouth 2 (two) times daily., Disp: 180 tablet, Rfl: 3   celecoxib (CELEBREX) 100 MG capsule, Take 100 mg by mouth 2 (two) times daily., Disp: , Rfl:    Cholecalciferol (VITAMIN D3) 2000 units TABS, Take 1 tablet by mouth daily., Disp: , Rfl:    diclofenac Sodium (VOLTAREN) 1 % GEL, Apply 4 g topically 4 (four) times daily., Disp: , Rfl:    Dulaglutide 0.75 MG/0.5ML SOAJ, Inject 0.75 mg into the skin once a week., Disp: , Rfl:    ferrous sulfate 325 (65 FE) MG tablet, Take 325 mg by mouth daily with breakfast., Disp: , Rfl:    folic acid (FOLVITE) 1 MG tablet, Take 1 mg by mouth daily., Disp: , Rfl:    furosemide  (LASIX ) 20 MG tablet, TAKE 1 TABLET (20 MG TOTAL) BY MOUTH DAILY. MAY TAKE AN ADDITIONAL 20 MG DAILY AS NEEDED, Disp: 135 tablet, Rfl: 3   gabapentin  (NEURONTIN ) 300 MG capsule, Take 1 capsule by mouth at bedtime., Disp: , Rfl: 2   HYDROcodone-acetaminophen  (NORCO) 7.5-325 MG per tablet, Take 1 tablet by mouth 2 (two) times daily as needed for moderate pain. 10-325 MG, Disp: , Rfl:    losartan  (COZAAR ) 50 MG tablet, Take 1 tablet (50 mg total) by mouth daily., Disp: 30 tablet, Rfl: 6   magnesium gluconate (MAGONATE) 500 MG tablet, Take 500 mg by mouth daily., Disp: , Rfl:    methocarbamol (ROBAXIN) 750 MG tablet, Take 750 mg by mouth 3 (three) times daily., Disp: , Rfl:    Multiple Vitamin (MULTIVITAMIN) tablet, Take 1 tablet by mouth daily., Disp: , Rfl:    multivitamin-iron-minerals-folic acid (CENTRUM) chewable tablet, Chew 1 tablet by mouth daily., Disp: , Rfl:    naloxone  (NARCAN) nasal spray 4 mg/0.1 mL, USE 1 SPRAY ONE NOSTRIL ONE TIME AS NEEDED (EMERGENCY USE ONLY). MAY REPEAT DOSE IN OTHER NOSTRIL ONCE IN 2 TO 3 MINUTES OR IF STILL NOT BREATHING.  PLEASE NOTIFY  THE CLINIC NURSE IF THIS IS EVER USED (EMERGENCY USE ONLY). MAY REPEAT DOSE IN OTHER NOSTRIL ONCE IN 2 TO 3 MINUTES OR IF STILL NOT BREATHING.  PLEASE NOTIFY THE CLINIC NURSE IF THIS IS EVER USED, Disp: , Rfl:    nisoldipine  (SULAR ) 34 MG 24 hr tablet, Take 1 tablet (34 mg total) by mouth daily., Disp: 30 tablet, Rfl: 6   nitroGLYCERIN  (NITROSTAT ) 0.4 MG SL tablet, Place 1 tablet (0.4 mg total) under the tongue every 5 (five) minutes as needed for chest pain., Disp: 25 tablet, Rfl: 3   omeprazole (PRILOSEC) 20 MG capsule, Take 20 mg by mouth daily., Disp: , Rfl:    penicillin v potassium (VEETID) 500 MG tablet, Take 500 mg by mouth as needed., Disp: , Rfl:    QUEtiapine (SEROQUEL) 200 MG tablet, Take 1 tablet by mouth at bedtime., Disp: , Rfl:    senna (SENOKOT) 8.6 MG tablet, Take 1 tablet by mouth daily as needed., Disp: , Rfl:    sildenafil (VIAGRA) 100 MG tablet, Take 100 mg by mouth daily as needed for erectile dysfunction., Disp: , Rfl:    terazosin  (HYTRIN ) 1 MG capsule, Take 1 mg by mouth at bedtime., Disp: , Rfl:    Tiotropium Bromide Monohydrate (SPIRIVA RESPIMAT) 2.5 MCG/ACT AERS, Inhale into the lungs., Disp: , Rfl:    Turmeric 500 MG CAPS, Take 1 tablet by mouth daily., Disp: , Rfl:    vitamin B-12 (CYANOCOBALAMIN) 1000 MCG tablet, Take 1,000 mcg by mouth daily., Disp: , Rfl:    BYDUREON BCISE 2 MG/0.85ML AUIJ, Inject into the skin once a week. (Patient not taking: Reported on 01/31/2024), Disp: , Rfl:    cyclobenzaprine  (FLEXERIL ) 10 MG tablet, Take 10 mg by mouth daily as needed for muscle spasms. (Patient not taking: Reported on 01/31/2024), Disp: , Rfl:       Objective:  BP 114/68   Pulse 77   Ht 5' 6 (1.676 m)   Wt 193 lb 6.4 oz (87.7 kg)   SpO2 95% Comment: ra  BMI 31.22 kg/m  Wt  Readings from Last 3 Encounters:  01/31/24 193 lb 6.4 oz (87.7 kg)  10/25/23 201 lb 9.6 oz (91.4 kg)  04/19/23 205 lb 3.2 oz (93.1 kg)   BMI Readings from Last 3 Encounters:  01/31/24 31.22 kg/m  10/25/23 32.05 kg/m  04/19/23 36.35 kg/m   SpO2 Readings from Last 3 Encounters:  01/31/24 95%  10/25/23 96%  04/19/23 97%    Physical Exam General: NAD, alert, WD, WN Eyes: PERRL, no scleral icterus ENMT: oropharynx clear, good dentition, no oral lesions, mallampati score IV, large tongue Skin: warm, intact, no rashes Neck: JVD flat, ROM and lymph node assessment normal, neck circ 16 inches CV: RRR, no MRG, nl S1 and S2, no peripheral edema Resp: clear to auscultation bilaterally, no wheezes, rales, or rhonchi, normal effort, no clubbing/cyanosis Abdom: Normoactive bowel sounds, soft, nontender, nondistended, no hepatosplenomegaly Neuro: Awake alert oriented to person place time and situation  Diagnostic Review:  Last CBC Lab Results  Component Value Date   WBC 9.5 01/07/2016   HGB 14.2 01/07/2016   HCT 43.6 01/07/2016   MCV 90.6 01/07/2016   MCH 29.5 01/07/2016   RDW 12.9 01/07/2016   PLT 241 01/07/2016   Last metabolic panel Lab Results  Component Value Date   GLUCOSE 134 (H) 01/07/2016   NA 139 01/07/2016   K 3.5 01/07/2016   CL 106 01/07/2016   CO2 25 01/07/2016   BUN  6 01/07/2016   CREATININE 0.70 01/07/2016   GFRNONAA >60 01/07/2016   CALCIUM  8.9 01/07/2016   PROT 6.5 01/07/2016   ALBUMIN 3.4 (L) 01/07/2016   BILITOT 0.4 01/07/2016   ALKPHOS 61 01/07/2016   AST 13 (L) 01/07/2016   ALT 18 01/07/2016   ANIONGAP 8 01/07/2016   PSG 2014 shows moderate OSA with AHI 20 PFTs 2018: mild restrictive defect with moderate reduction in gas transfer; borderline bronchodilator response.    Assessment & Plan:   Assessment & Plan Mild intermittent asthma without complication ACT 24 indicating very good control. The patient is currently on PRN Albuterol  MDI as  needed. He uses it infrequently. We will continue with current plan. Obstructive sleep apnea Diagnosed with moderate sleep apnea and prescribed BiPAP 14/10 from [place]. The patient is not using it very much. He notes a 25 lbs weight loss over the last two years. He notes improvement in snoring and lack of apnea episodes. His ESS is essentially 0. I suspect his OSA has dramatically improvement. I suggested repeating sleep study to evaluate severity of OSA but patient notes that CPAP and supplies are provided free from the [facility] and he thinks he might go back to using it once his back pain improves.  No orders of the defined types were placed in this encounter.  I spent 36 minutes reviewing patient's chart including prior consultant notes, imaging, and PFTs as well as face-to-face with the patient, over half in discussion of the diagnosis and the importance of compliance with the treatment plan.  Return in about 1 year (around 01/30/2025).   Sopheap Basic, MD

## 2024-01-31 NOTE — Patient Instructions (Signed)
-   Continue to lose weight as much as possible - Continue to use albuterol  inhaler as needed for SOB - Come back in a year.

## 2024-05-21 ENCOUNTER — Other Ambulatory Visit: Payer: Self-pay | Admitting: Cardiology
